# Patient Record
Sex: Female | Born: 1935 | Race: White | Hispanic: No | Smoking: Former smoker
Health system: Southern US, Community
[De-identification: ages and names within clinical notes are randomized; demographics above are authoritative.]

## PROBLEM LIST (undated history)

## (undated) DIAGNOSIS — N183 Chronic kidney disease, stage 3 unspecified: Secondary | ICD-10-CM

## (undated) DIAGNOSIS — K2270 Barrett's esophagus without dysplasia: Secondary | ICD-10-CM

## (undated) DIAGNOSIS — K589 Irritable bowel syndrome without diarrhea: Secondary | ICD-10-CM

## (undated) DIAGNOSIS — I4891 Unspecified atrial fibrillation: Secondary | ICD-10-CM

## (undated) DIAGNOSIS — F419 Anxiety disorder, unspecified: Secondary | ICD-10-CM

## (undated) DIAGNOSIS — F17201 Nicotine dependence, unspecified, in remission: Secondary | ICD-10-CM

## (undated) DIAGNOSIS — I10 Essential (primary) hypertension: Secondary | ICD-10-CM

## (undated) DIAGNOSIS — E119 Type 2 diabetes mellitus without complications: Secondary | ICD-10-CM

## (undated) DIAGNOSIS — E785 Hyperlipidemia, unspecified: Secondary | ICD-10-CM

## (undated) DIAGNOSIS — I2510 Atherosclerotic heart disease of native coronary artery without angina pectoris: Secondary | ICD-10-CM

## (undated) DIAGNOSIS — K5792 Diverticulitis of intestine, part unspecified, without perforation or abscess without bleeding: Secondary | ICD-10-CM

## (undated) DIAGNOSIS — N184 Chronic kidney disease, stage 4 (severe): Secondary | ICD-10-CM

## (undated) DIAGNOSIS — I679 Cerebrovascular disease, unspecified: Secondary | ICD-10-CM

## (undated) HISTORY — PX: BREAST EXCISIONAL BIOPSY: SUR124

## (undated) HISTORY — DX: Essential (primary) hypertension: I10

## (undated) HISTORY — PX: VENTRAL HERNIA REPAIR: SHX424

## (undated) HISTORY — DX: Atherosclerotic heart disease of native coronary artery without angina pectoris: I25.10

## (undated) HISTORY — DX: Hyperlipidemia, unspecified: E78.5

## (undated) HISTORY — PX: APPENDECTOMY: SHX54

## (undated) HISTORY — DX: Barrett's esophagus without dysplasia: K22.70

## (undated) HISTORY — DX: Cerebrovascular disease, unspecified: I67.9

## (undated) HISTORY — PX: THYROIDECTOMY, PARTIAL: SHX18

## (undated) HISTORY — PX: TONSILLECTOMY: SUR1361

## (undated) HISTORY — DX: Irritable bowel syndrome, unspecified: K58.9

## (undated) HISTORY — DX: Chronic kidney disease, stage 3 (moderate): N18.3

## (undated) HISTORY — DX: Anxiety disorder, unspecified: F41.9

## (undated) HISTORY — PX: KIDNEY SURGERY: SHX687

## (undated) HISTORY — DX: Diverticulitis of intestine, part unspecified, without perforation or abscess without bleeding: K57.92

## (undated) HISTORY — DX: Nicotine dependence, unspecified, in remission: F17.201

## (undated) HISTORY — DX: Chronic kidney disease, stage 3 unspecified: N18.30

---

## 1991-12-12 HISTORY — PX: COLOSTOMY: SHX63

## 1994-12-11 HISTORY — PX: CORONARY ARTERY BYPASS GRAFT: SHX141

## 2002-05-02 ENCOUNTER — Encounter: Payer: Self-pay | Admitting: General Surgery

## 2002-05-02 ENCOUNTER — Inpatient Hospital Stay (HOSPITAL_COMMUNITY): Admission: EM | Admit: 2002-05-02 | Discharge: 2002-05-10 | Payer: Self-pay | Admitting: Emergency Medicine

## 2002-05-02 ENCOUNTER — Encounter: Payer: Self-pay | Admitting: Emergency Medicine

## 2002-05-08 ENCOUNTER — Encounter: Payer: Self-pay | Admitting: Internal Medicine

## 2002-09-18 ENCOUNTER — Ambulatory Visit (HOSPITAL_COMMUNITY): Admission: RE | Admit: 2002-09-18 | Discharge: 2002-09-18 | Payer: Self-pay | Admitting: Internal Medicine

## 2002-09-18 ENCOUNTER — Encounter: Payer: Self-pay | Admitting: Internal Medicine

## 2002-12-07 ENCOUNTER — Inpatient Hospital Stay (HOSPITAL_COMMUNITY): Admission: EM | Admit: 2002-12-07 | Discharge: 2002-12-12 | Payer: Self-pay | Admitting: Emergency Medicine

## 2003-05-04 ENCOUNTER — Ambulatory Visit (HOSPITAL_COMMUNITY): Admission: RE | Admit: 2003-05-04 | Discharge: 2003-05-04 | Payer: Self-pay | Admitting: Internal Medicine

## 2003-07-24 ENCOUNTER — Ambulatory Visit (HOSPITAL_COMMUNITY): Admission: RE | Admit: 2003-07-24 | Discharge: 2003-07-24 | Payer: Self-pay | Admitting: Family Medicine

## 2003-07-24 ENCOUNTER — Encounter: Payer: Self-pay | Admitting: Family Medicine

## 2003-12-01 ENCOUNTER — Inpatient Hospital Stay (HOSPITAL_COMMUNITY): Admission: EM | Admit: 2003-12-01 | Discharge: 2003-12-05 | Payer: Self-pay | Admitting: Emergency Medicine

## 2003-12-12 DIAGNOSIS — K2270 Barrett's esophagus without dysplasia: Secondary | ICD-10-CM

## 2003-12-12 HISTORY — DX: Barrett's esophagus without dysplasia: K22.70

## 2003-12-12 HISTORY — PX: COLONOSCOPY: SHX174

## 2004-01-26 ENCOUNTER — Inpatient Hospital Stay (HOSPITAL_COMMUNITY): Admission: EM | Admit: 2004-01-26 | Discharge: 2004-01-27 | Payer: Self-pay | Admitting: Emergency Medicine

## 2004-04-11 ENCOUNTER — Encounter (INDEPENDENT_AMBULATORY_CARE_PROVIDER_SITE_OTHER): Payer: Self-pay | Admitting: Family Medicine

## 2004-04-11 ENCOUNTER — Ambulatory Visit (HOSPITAL_COMMUNITY): Admission: RE | Admit: 2004-04-11 | Discharge: 2004-04-11 | Payer: Self-pay | Admitting: Internal Medicine

## 2004-05-19 ENCOUNTER — Ambulatory Visit (HOSPITAL_COMMUNITY): Admission: RE | Admit: 2004-05-19 | Discharge: 2004-05-19 | Payer: Self-pay | Admitting: Family Medicine

## 2005-05-09 ENCOUNTER — Ambulatory Visit: Payer: Self-pay | Admitting: Internal Medicine

## 2005-07-19 ENCOUNTER — Ambulatory Visit (HOSPITAL_COMMUNITY): Admission: RE | Admit: 2005-07-19 | Discharge: 2005-07-19 | Payer: Self-pay | Admitting: Family Medicine

## 2005-08-02 ENCOUNTER — Ambulatory Visit: Payer: Self-pay | Admitting: Cardiology

## 2005-08-09 ENCOUNTER — Ambulatory Visit (HOSPITAL_COMMUNITY): Admission: RE | Admit: 2005-08-09 | Discharge: 2005-08-09 | Payer: Self-pay | Admitting: Family Medicine

## 2005-12-21 ENCOUNTER — Emergency Department (HOSPITAL_COMMUNITY): Admission: EM | Admit: 2005-12-21 | Discharge: 2005-12-21 | Payer: Self-pay | Admitting: Emergency Medicine

## 2006-05-02 ENCOUNTER — Ambulatory Visit (HOSPITAL_COMMUNITY): Admission: RE | Admit: 2006-05-02 | Discharge: 2006-05-02 | Payer: Self-pay | Admitting: Family Medicine

## 2006-05-07 ENCOUNTER — Ambulatory Visit: Payer: Self-pay | Admitting: Cardiology

## 2006-05-07 ENCOUNTER — Inpatient Hospital Stay (HOSPITAL_COMMUNITY): Admission: EM | Admit: 2006-05-07 | Discharge: 2006-05-10 | Payer: Self-pay | Admitting: Emergency Medicine

## 2006-07-20 ENCOUNTER — Ambulatory Visit: Payer: Self-pay | Admitting: Family Medicine

## 2006-08-17 ENCOUNTER — Ambulatory Visit: Payer: Self-pay | Admitting: Family Medicine

## 2006-09-05 ENCOUNTER — Ambulatory Visit: Payer: Self-pay | Admitting: Family Medicine

## 2006-09-06 ENCOUNTER — Ambulatory Visit (HOSPITAL_COMMUNITY): Admission: RE | Admit: 2006-09-06 | Discharge: 2006-09-06 | Payer: Self-pay | Admitting: Family Medicine

## 2006-10-03 ENCOUNTER — Ambulatory Visit: Payer: Self-pay | Admitting: Family Medicine

## 2006-10-31 ENCOUNTER — Encounter (INDEPENDENT_AMBULATORY_CARE_PROVIDER_SITE_OTHER): Payer: Self-pay | Admitting: Family Medicine

## 2006-11-07 ENCOUNTER — Ambulatory Visit: Payer: Self-pay | Admitting: Family Medicine

## 2006-11-09 ENCOUNTER — Encounter: Payer: Self-pay | Admitting: Family Medicine

## 2006-11-09 DIAGNOSIS — H269 Unspecified cataract: Secondary | ICD-10-CM

## 2006-11-09 DIAGNOSIS — F411 Generalized anxiety disorder: Secondary | ICD-10-CM | POA: Insufficient documentation

## 2006-11-09 DIAGNOSIS — D509 Iron deficiency anemia, unspecified: Secondary | ICD-10-CM

## 2006-11-09 DIAGNOSIS — E785 Hyperlipidemia, unspecified: Secondary | ICD-10-CM

## 2006-11-09 DIAGNOSIS — M810 Age-related osteoporosis without current pathological fracture: Secondary | ICD-10-CM | POA: Insufficient documentation

## 2006-11-09 DIAGNOSIS — E871 Hypo-osmolality and hyponatremia: Secondary | ICD-10-CM

## 2006-11-28 ENCOUNTER — Ambulatory Visit (HOSPITAL_COMMUNITY): Admission: RE | Admit: 2006-11-28 | Discharge: 2006-11-28 | Payer: Self-pay | Admitting: Family Medicine

## 2006-12-20 ENCOUNTER — Ambulatory Visit: Payer: Self-pay | Admitting: Family Medicine

## 2007-01-17 ENCOUNTER — Ambulatory Visit: Payer: Self-pay | Admitting: Family Medicine

## 2007-01-17 DIAGNOSIS — K219 Gastro-esophageal reflux disease without esophagitis: Secondary | ICD-10-CM

## 2007-01-17 DIAGNOSIS — I872 Venous insufficiency (chronic) (peripheral): Secondary | ICD-10-CM | POA: Insufficient documentation

## 2007-01-17 DIAGNOSIS — E119 Type 2 diabetes mellitus without complications: Secondary | ICD-10-CM | POA: Insufficient documentation

## 2007-01-17 LAB — CONVERTED CEMR LAB
Cholesterol, target level: 200 mg/dL
HDL goal, serum: 40 mg/dL
LDL Goal: 70 mg/dL

## 2007-01-28 ENCOUNTER — Encounter (INDEPENDENT_AMBULATORY_CARE_PROVIDER_SITE_OTHER): Payer: Self-pay | Admitting: Family Medicine

## 2007-02-18 ENCOUNTER — Telehealth (INDEPENDENT_AMBULATORY_CARE_PROVIDER_SITE_OTHER): Payer: Self-pay | Admitting: Family Medicine

## 2007-02-19 ENCOUNTER — Ambulatory Visit: Payer: Self-pay | Admitting: Family Medicine

## 2007-02-25 ENCOUNTER — Telehealth (INDEPENDENT_AMBULATORY_CARE_PROVIDER_SITE_OTHER): Payer: Self-pay | Admitting: Family Medicine

## 2007-03-15 ENCOUNTER — Encounter (INDEPENDENT_AMBULATORY_CARE_PROVIDER_SITE_OTHER): Payer: Self-pay | Admitting: Family Medicine

## 2007-03-19 ENCOUNTER — Encounter (INDEPENDENT_AMBULATORY_CARE_PROVIDER_SITE_OTHER): Payer: Self-pay | Admitting: Family Medicine

## 2007-04-08 ENCOUNTER — Encounter (INDEPENDENT_AMBULATORY_CARE_PROVIDER_SITE_OTHER): Payer: Self-pay | Admitting: Family Medicine

## 2007-04-16 ENCOUNTER — Encounter (INDEPENDENT_AMBULATORY_CARE_PROVIDER_SITE_OTHER): Payer: Self-pay | Admitting: Family Medicine

## 2007-04-17 ENCOUNTER — Ambulatory Visit: Payer: Self-pay | Admitting: Family Medicine

## 2007-04-17 DIAGNOSIS — K2270 Barrett's esophagus without dysplasia: Secondary | ICD-10-CM | POA: Insufficient documentation

## 2007-04-17 DIAGNOSIS — R1310 Dysphagia, unspecified: Secondary | ICD-10-CM | POA: Insufficient documentation

## 2007-04-17 DIAGNOSIS — J301 Allergic rhinitis due to pollen: Secondary | ICD-10-CM | POA: Insufficient documentation

## 2007-04-22 ENCOUNTER — Encounter (INDEPENDENT_AMBULATORY_CARE_PROVIDER_SITE_OTHER): Payer: Self-pay | Admitting: Family Medicine

## 2007-04-25 ENCOUNTER — Encounter (INDEPENDENT_AMBULATORY_CARE_PROVIDER_SITE_OTHER): Payer: Self-pay | Admitting: Family Medicine

## 2007-04-30 ENCOUNTER — Encounter (INDEPENDENT_AMBULATORY_CARE_PROVIDER_SITE_OTHER): Payer: Self-pay | Admitting: Family Medicine

## 2007-05-03 ENCOUNTER — Encounter (INDEPENDENT_AMBULATORY_CARE_PROVIDER_SITE_OTHER): Payer: Self-pay | Admitting: Family Medicine

## 2007-05-09 ENCOUNTER — Ambulatory Visit: Payer: Self-pay | Admitting: Internal Medicine

## 2007-05-13 ENCOUNTER — Encounter (INDEPENDENT_AMBULATORY_CARE_PROVIDER_SITE_OTHER): Payer: Self-pay | Admitting: Family Medicine

## 2007-05-13 ENCOUNTER — Ambulatory Visit (HOSPITAL_COMMUNITY): Admission: RE | Admit: 2007-05-13 | Discharge: 2007-05-13 | Payer: Self-pay | Admitting: Internal Medicine

## 2007-05-13 ENCOUNTER — Ambulatory Visit: Payer: Self-pay | Admitting: Internal Medicine

## 2007-05-13 ENCOUNTER — Encounter (INDEPENDENT_AMBULATORY_CARE_PROVIDER_SITE_OTHER): Payer: Self-pay | Admitting: Internal Medicine

## 2007-05-13 HISTORY — PX: ESOPHAGOGASTRODUODENOSCOPY: SHX1529

## 2007-05-14 ENCOUNTER — Encounter (INDEPENDENT_AMBULATORY_CARE_PROVIDER_SITE_OTHER): Payer: Self-pay | Admitting: Family Medicine

## 2007-05-16 ENCOUNTER — Encounter (INDEPENDENT_AMBULATORY_CARE_PROVIDER_SITE_OTHER): Payer: Self-pay | Admitting: Family Medicine

## 2007-05-18 ENCOUNTER — Encounter (INDEPENDENT_AMBULATORY_CARE_PROVIDER_SITE_OTHER): Payer: Self-pay | Admitting: Family Medicine

## 2007-05-20 LAB — CONVERTED CEMR LAB
AST: 17 units/L (ref 0–37)
Albumin: 4.6 g/dL (ref 3.5–5.2)
Alkaline Phosphatase: 50 units/L (ref 39–117)
BUN: 15 mg/dL (ref 6–23)
Basophils Relative: 1 % (ref 0–1)
Calcium: 9.1 mg/dL (ref 8.4–10.5)
Chloride: 97 meq/L (ref 96–112)
Eosinophils Absolute: 0.2 10*3/uL (ref 0.0–0.7)
HDL: 63 mg/dL (ref >39)
LDL Cholesterol: 83 mg/dL (ref 0–99)
Lymphs Abs: 1.2 10*3/uL (ref 0.7–3.3)
MCHC: 32.9 g/dL (ref 30.0–36.0)
Monocytes Relative: 9 % (ref 3–11)
Neutro Abs: 3.9 10*3/uL (ref 1.7–7.7)
Neutrophils Relative %: 67 % (ref 43–77)
Platelets: 285 10*3/uL (ref 150–400)
Potassium: 4.4 meq/L (ref 3.5–5.3)
RBC: 4.13 M/uL (ref 3.87–5.11)
Sodium: 136 meq/L (ref 135–145)
Total Protein: 7.8 g/dL (ref 6.0–8.3)
WBC: 5.8 10*3/uL (ref 4.0–10.5)

## 2007-05-24 ENCOUNTER — Encounter (INDEPENDENT_AMBULATORY_CARE_PROVIDER_SITE_OTHER): Payer: Self-pay | Admitting: Family Medicine

## 2007-06-17 ENCOUNTER — Emergency Department (HOSPITAL_COMMUNITY): Admission: EM | Admit: 2007-06-17 | Discharge: 2007-06-17 | Payer: Self-pay | Admitting: Emergency Medicine

## 2007-06-19 ENCOUNTER — Telehealth (INDEPENDENT_AMBULATORY_CARE_PROVIDER_SITE_OTHER): Payer: Self-pay | Admitting: *Deleted

## 2007-06-20 ENCOUNTER — Telehealth (INDEPENDENT_AMBULATORY_CARE_PROVIDER_SITE_OTHER): Payer: Self-pay | Admitting: *Deleted

## 2007-06-20 ENCOUNTER — Ambulatory Visit: Payer: Self-pay | Admitting: Family Medicine

## 2007-06-20 DIAGNOSIS — K589 Irritable bowel syndrome without diarrhea: Secondary | ICD-10-CM

## 2007-06-20 LAB — CONVERTED CEMR LAB
BUN: 12 mg/dL (ref 6–23)
Basophils Relative: 0 % (ref 0–1)
Bilirubin Urine: NEGATIVE
CO2: 24 meq/L (ref 19–32)
Chloride: 92 meq/L — ABNORMAL LOW (ref 96–112)
Glucose, Bld: 116 mg/dL
Glucose, Urine, Semiquant: NEGATIVE
Hemoglobin: 11.5 g/dL — ABNORMAL LOW (ref 12.0–15.0)
Ketones, urine, test strip: NEGATIVE
Lymphocytes Relative: 15 % (ref 12–46)
Lymphs Abs: 1.3 10*3/uL (ref 0.7–3.3)
MCHC: 33.7 g/dL (ref 30.0–36.0)
Monocytes Absolute: 0.6 10*3/uL (ref 0.2–0.7)
Monocytes Relative: 7 % (ref 3–11)
Neutro Abs: 6.2 10*3/uL (ref 1.7–7.7)
Neutrophils Relative %: 75 % (ref 43–77)
Potassium: 4.5 meq/L (ref 3.5–5.3)
RBC: 3.83 M/uL — ABNORMAL LOW (ref 3.87–5.11)
Specific Gravity, Urine: 1.015
WBC: 8.3 10*3/uL (ref 4.0–10.5)
pH: 7.5

## 2007-06-24 ENCOUNTER — Ambulatory Visit: Payer: Self-pay | Admitting: Family Medicine

## 2007-07-04 ENCOUNTER — Encounter (INDEPENDENT_AMBULATORY_CARE_PROVIDER_SITE_OTHER): Payer: Self-pay | Admitting: Family Medicine

## 2007-07-12 ENCOUNTER — Encounter (INDEPENDENT_AMBULATORY_CARE_PROVIDER_SITE_OTHER): Payer: Self-pay | Admitting: Family Medicine

## 2007-08-05 ENCOUNTER — Ambulatory Visit: Payer: Self-pay | Admitting: Family Medicine

## 2007-08-05 ENCOUNTER — Telehealth (INDEPENDENT_AMBULATORY_CARE_PROVIDER_SITE_OTHER): Payer: Self-pay | Admitting: *Deleted

## 2007-08-05 ENCOUNTER — Ambulatory Visit (HOSPITAL_COMMUNITY): Admission: RE | Admit: 2007-08-05 | Discharge: 2007-08-05 | Payer: Self-pay | Admitting: Family Medicine

## 2007-08-05 DIAGNOSIS — M25519 Pain in unspecified shoulder: Secondary | ICD-10-CM | POA: Insufficient documentation

## 2007-08-05 LAB — CONVERTED CEMR LAB: Hgb A1c MFr Bld: 6.1 %

## 2007-08-06 ENCOUNTER — Encounter (INDEPENDENT_AMBULATORY_CARE_PROVIDER_SITE_OTHER): Payer: Self-pay | Admitting: Family Medicine

## 2007-08-13 ENCOUNTER — Encounter (INDEPENDENT_AMBULATORY_CARE_PROVIDER_SITE_OTHER): Payer: Self-pay | Admitting: Family Medicine

## 2007-08-14 ENCOUNTER — Encounter (INDEPENDENT_AMBULATORY_CARE_PROVIDER_SITE_OTHER): Payer: Self-pay | Admitting: Family Medicine

## 2007-08-19 ENCOUNTER — Encounter (INDEPENDENT_AMBULATORY_CARE_PROVIDER_SITE_OTHER): Payer: Self-pay | Admitting: Family Medicine

## 2007-09-16 ENCOUNTER — Ambulatory Visit: Payer: Self-pay | Admitting: Family Medicine

## 2007-11-04 ENCOUNTER — Ambulatory Visit: Payer: Self-pay | Admitting: Family Medicine

## 2007-11-04 DIAGNOSIS — M1990 Unspecified osteoarthritis, unspecified site: Secondary | ICD-10-CM | POA: Insufficient documentation

## 2007-11-04 LAB — CONVERTED CEMR LAB

## 2007-11-13 ENCOUNTER — Encounter (INDEPENDENT_AMBULATORY_CARE_PROVIDER_SITE_OTHER): Payer: Self-pay | Admitting: Family Medicine

## 2007-11-14 ENCOUNTER — Encounter (INDEPENDENT_AMBULATORY_CARE_PROVIDER_SITE_OTHER): Payer: Self-pay | Admitting: Family Medicine

## 2007-12-13 ENCOUNTER — Encounter (INDEPENDENT_AMBULATORY_CARE_PROVIDER_SITE_OTHER): Payer: Self-pay | Admitting: Family Medicine

## 2008-01-14 ENCOUNTER — Encounter (INDEPENDENT_AMBULATORY_CARE_PROVIDER_SITE_OTHER): Payer: Self-pay | Admitting: Family Medicine

## 2008-02-05 ENCOUNTER — Ambulatory Visit: Payer: Self-pay | Admitting: Family Medicine

## 2008-02-05 LAB — CONVERTED CEMR LAB
Glucose, Bld: 111 mg/dL
Hgb A1c MFr Bld: 6.3 %

## 2008-02-11 ENCOUNTER — Encounter (INDEPENDENT_AMBULATORY_CARE_PROVIDER_SITE_OTHER): Payer: Self-pay | Admitting: Family Medicine

## 2008-02-20 ENCOUNTER — Encounter (INDEPENDENT_AMBULATORY_CARE_PROVIDER_SITE_OTHER): Payer: Self-pay | Admitting: Family Medicine

## 2008-02-21 ENCOUNTER — Telehealth (INDEPENDENT_AMBULATORY_CARE_PROVIDER_SITE_OTHER): Payer: Self-pay | Admitting: *Deleted

## 2008-02-21 LAB — CONVERTED CEMR LAB
ALT: 15 units/L (ref 0–35)
AST: 15 units/L (ref 0–37)
Alkaline Phosphatase: 41 units/L (ref 39–117)
Basophils Absolute: 0 10*3/uL (ref 0.0–0.1)
Basophils Relative: 0 % (ref 0–1)
Creatinine, Ser: 1.1 mg/dL (ref 0.40–1.20)
Creatinine, Urine: 69.5 mg/dL
Eosinophils Relative: 3 % (ref 0–5)
HCT: 34.9 % — ABNORMAL LOW (ref 36.0–46.0)
Hemoglobin: 11.3 g/dL — ABNORMAL LOW (ref 12.0–15.0)
LDL Cholesterol: 81 mg/dL (ref 0–99)
MCHC: 32.4 g/dL (ref 30.0–36.0)
Monocytes Absolute: 0.7 10*3/uL (ref 0.1–1.0)
Platelets: 246 10*3/uL (ref 150–400)
RDW: 13.5 % (ref 11.5–15.5)
Sodium: 137 meq/L (ref 135–145)
TSH: 1.418 microintl units/mL (ref 0.350–5.50)
Total Bilirubin: 0.4 mg/dL (ref 0.3–1.2)
Total CHOL/HDL Ratio: 2.8
VLDL: 34 mg/dL (ref 0–40)

## 2008-02-29 ENCOUNTER — Emergency Department (HOSPITAL_COMMUNITY): Admission: EM | Admit: 2008-02-29 | Discharge: 2008-02-29 | Payer: Self-pay | Admitting: Emergency Medicine

## 2008-03-06 ENCOUNTER — Encounter (INDEPENDENT_AMBULATORY_CARE_PROVIDER_SITE_OTHER): Payer: Self-pay | Admitting: Family Medicine

## 2008-03-11 ENCOUNTER — Ambulatory Visit: Payer: Self-pay | Admitting: Family Medicine

## 2008-03-11 DIAGNOSIS — N183 Chronic kidney disease, stage 3 (moderate): Secondary | ICD-10-CM

## 2008-03-12 ENCOUNTER — Encounter (INDEPENDENT_AMBULATORY_CARE_PROVIDER_SITE_OTHER): Payer: Self-pay | Admitting: Family Medicine

## 2008-03-18 ENCOUNTER — Encounter (INDEPENDENT_AMBULATORY_CARE_PROVIDER_SITE_OTHER): Payer: Self-pay | Admitting: Family Medicine

## 2008-04-09 ENCOUNTER — Encounter (INDEPENDENT_AMBULATORY_CARE_PROVIDER_SITE_OTHER): Payer: Self-pay | Admitting: Family Medicine

## 2008-05-07 ENCOUNTER — Ambulatory Visit (HOSPITAL_COMMUNITY): Admission: RE | Admit: 2008-05-07 | Discharge: 2008-05-07 | Payer: Self-pay | Admitting: Internal Medicine

## 2008-05-07 ENCOUNTER — Ambulatory Visit: Payer: Self-pay | Admitting: Internal Medicine

## 2008-05-07 LAB — CONVERTED CEMR LAB
ALT: 17 units/L (ref 0–35)
AST: 20 units/L (ref 0–37)
Basophils Absolute: 0 10*3/uL (ref 0.0–0.1)
Basophils Relative: 0 % (ref 0–1)
Blood in Urine, dipstick: NEGATIVE
Calcium: 8.7 mg/dL (ref 8.4–10.5)
Chloride: 97 meq/L (ref 96–112)
Creatinine, Ser: 1.25 mg/dL — ABNORMAL HIGH (ref 0.40–1.20)
Hemoglobin: 10.6 g/dL — ABNORMAL LOW (ref 12.0–15.0)
Inflenza A Ag: NEGATIVE
MCHC: 35.2 g/dL (ref 30.0–36.0)
Monocytes Absolute: 1 10*3/uL (ref 0.1–1.0)
Neutro Abs: 20.4 10*3/uL — ABNORMAL HIGH (ref 1.7–7.7)
Nitrite: NEGATIVE
RDW: 12.8 % (ref 11.5–15.5)
Specific Gravity, Urine: 1.01
Total Bilirubin: 0.5 mg/dL (ref 0.3–1.2)

## 2008-05-08 ENCOUNTER — Encounter (INDEPENDENT_AMBULATORY_CARE_PROVIDER_SITE_OTHER): Payer: Self-pay | Admitting: Internal Medicine

## 2008-05-11 ENCOUNTER — Ambulatory Visit (HOSPITAL_COMMUNITY): Admission: RE | Admit: 2008-05-11 | Discharge: 2008-05-11 | Payer: Self-pay | Admitting: Family Medicine

## 2008-05-11 ENCOUNTER — Ambulatory Visit: Payer: Self-pay | Admitting: Family Medicine

## 2008-05-13 LAB — CONVERTED CEMR LAB
Basophils Absolute: 0 10*3/uL (ref 0.0–0.1)
Eosinophils Relative: 3 % (ref 0–5)
Hemoglobin: 11.4 g/dL — ABNORMAL LOW (ref 12.0–15.0)
Lymphocytes Relative: 17 % (ref 12–46)
Lymphs Abs: 1.1 10*3/uL (ref 0.7–4.0)
Neutro Abs: 4.6 10*3/uL (ref 1.7–7.7)
Neutrophils Relative %: 72 % (ref 43–77)
Platelets: 243 10*3/uL (ref 150–400)
RBC: 3.67 M/uL — ABNORMAL LOW (ref 3.87–5.11)
RDW: 12.9 % (ref 11.5–15.5)

## 2008-05-14 ENCOUNTER — Telehealth (INDEPENDENT_AMBULATORY_CARE_PROVIDER_SITE_OTHER): Payer: Self-pay | Admitting: *Deleted

## 2008-05-18 ENCOUNTER — Ambulatory Visit: Payer: Self-pay | Admitting: Family Medicine

## 2008-05-19 ENCOUNTER — Telehealth (INDEPENDENT_AMBULATORY_CARE_PROVIDER_SITE_OTHER): Payer: Self-pay | Admitting: Family Medicine

## 2008-05-20 ENCOUNTER — Encounter (INDEPENDENT_AMBULATORY_CARE_PROVIDER_SITE_OTHER): Payer: Self-pay | Admitting: Family Medicine

## 2008-06-01 ENCOUNTER — Ambulatory Visit: Payer: Self-pay | Admitting: Family Medicine

## 2008-06-04 ENCOUNTER — Ambulatory Visit (HOSPITAL_COMMUNITY): Admission: RE | Admit: 2008-06-04 | Discharge: 2008-06-04 | Payer: Self-pay | Admitting: Family Medicine

## 2008-06-14 IMAGING — CT CT PELVIS W/O CM
2 of 4 series · 16 of 46 positions shown, 18 images · IV contrast (agent unspecified)
Comparison: 05/07/06.

CLINICAL DATA: Abdominal and pelvic pain.  Previous colon resection for perforation and appendectomy.  Allergic to IV contrast.  
 ABDOMEN CT WITHOUT CONTRAST:
TECHNIQUE: Multidetector CT imaging of the abdomen was performed following the standard protocol without IV contrast.  Oral contrast was administered.
TECHNIQUE: Multidetector CT imaging of the pelvis was performed following the standard protocol without IV contrast.

[Series 2: abd|pel w/o 5.0 b40f · axial · non-contrast · 0.66mm/px · z∈[-420,-35]mm · 13 of 85 slices shown, 15 images]
[im 4/85  soft-tissue]
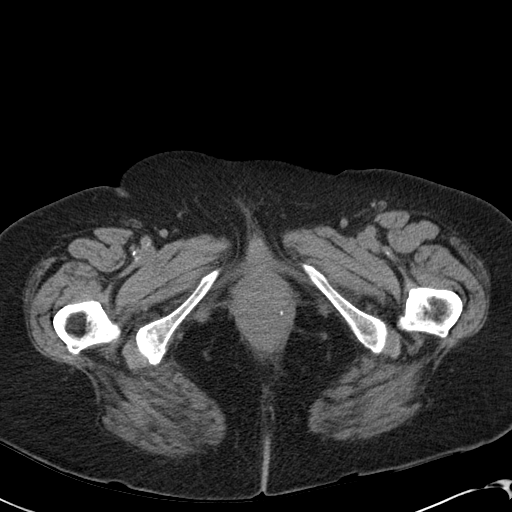
[im 4/85  bone]
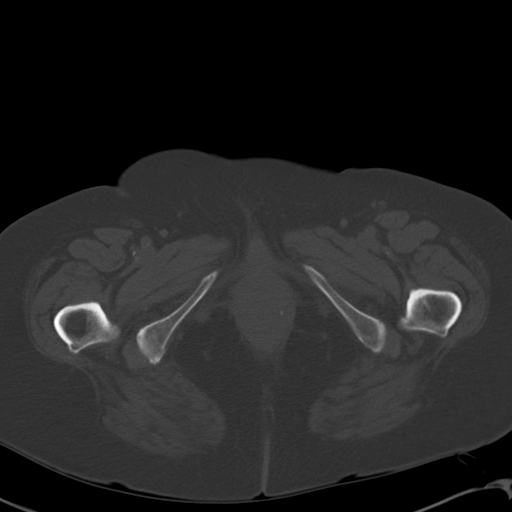
[im 11/85  soft-tissue]
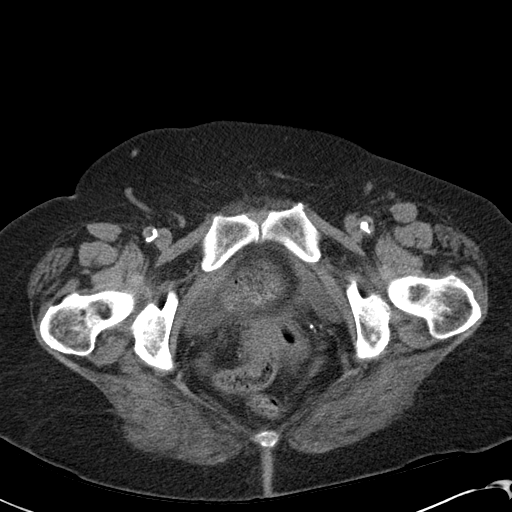
[im 17/85  soft-tissue]
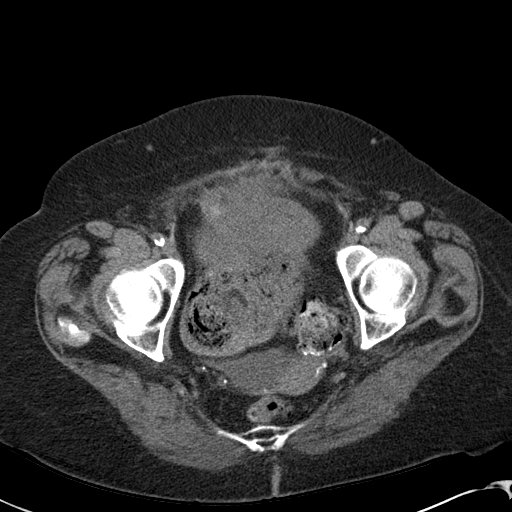
[im 24/85  soft-tissue]
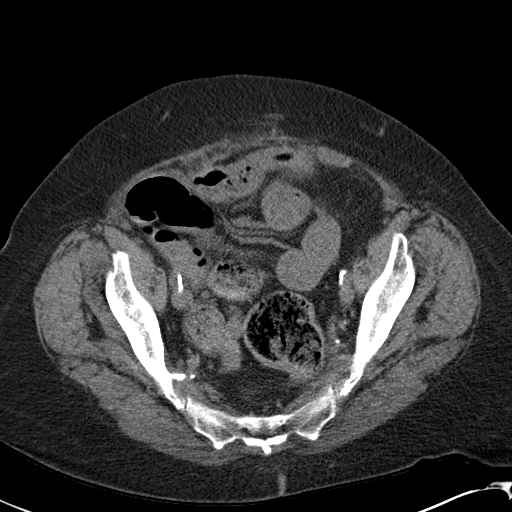
[im 31/85  soft-tissue]
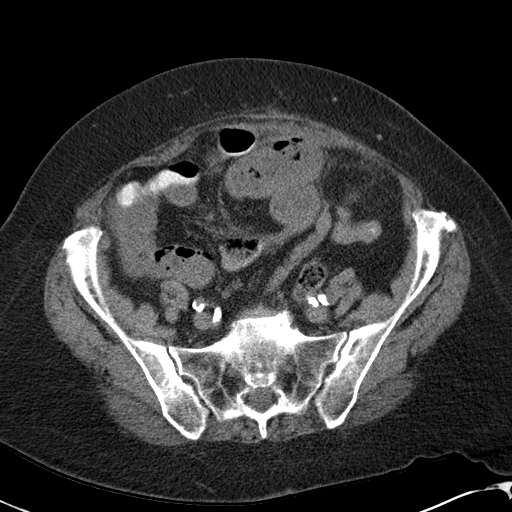
[im 37/85  soft-tissue]
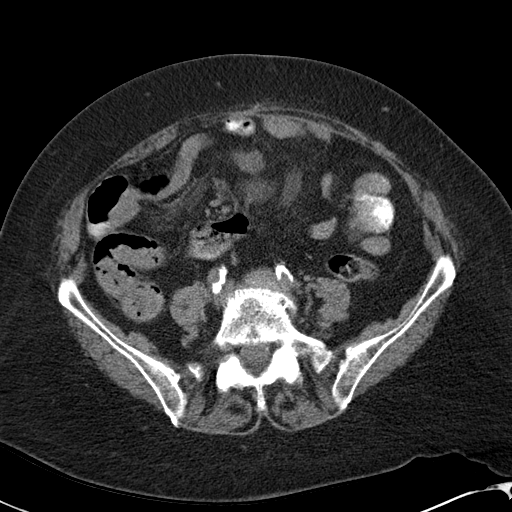
[im 44/85  soft-tissue]
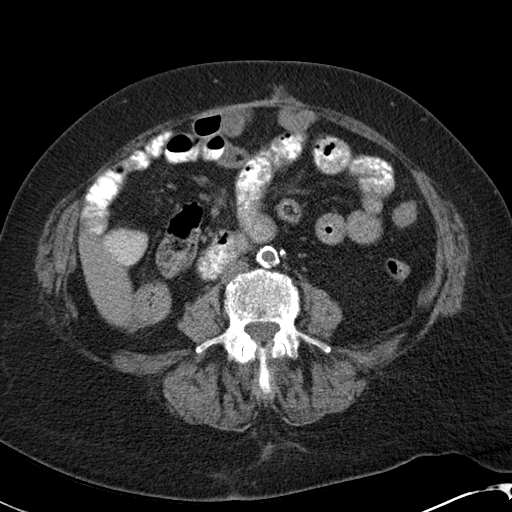
[im 48/85  soft-tissue]
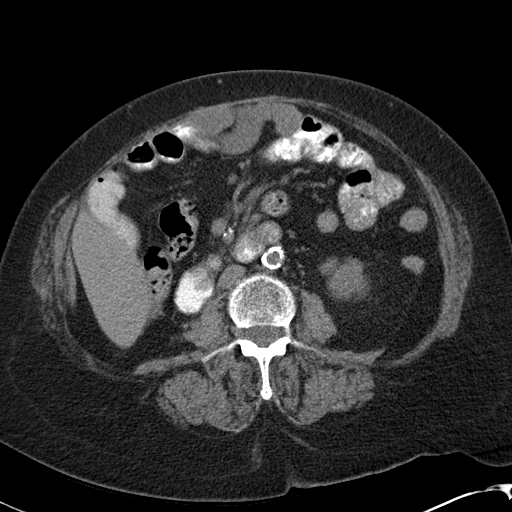
[im 54/85  soft-tissue]
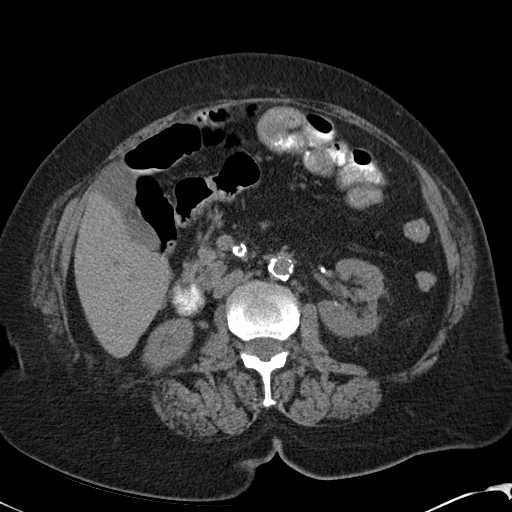
[im 54/85  bone]
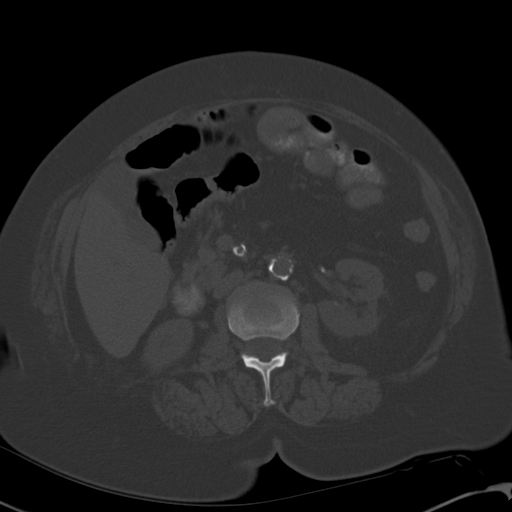
[im 61/85  soft-tissue]
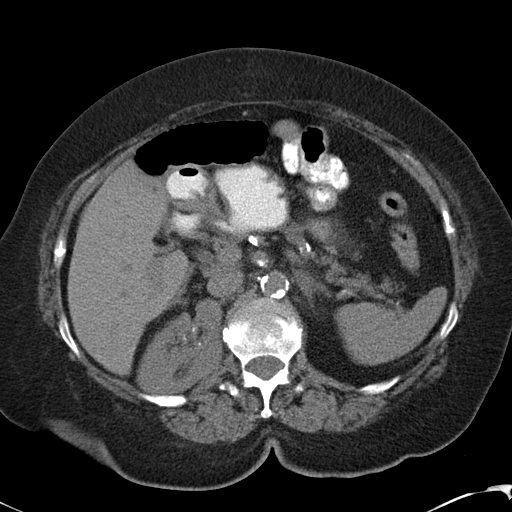
[im 68/85  soft-tissue]
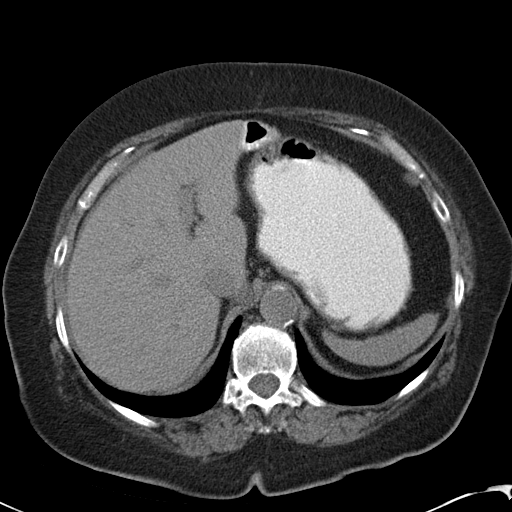
[im 74/85  soft-tissue]
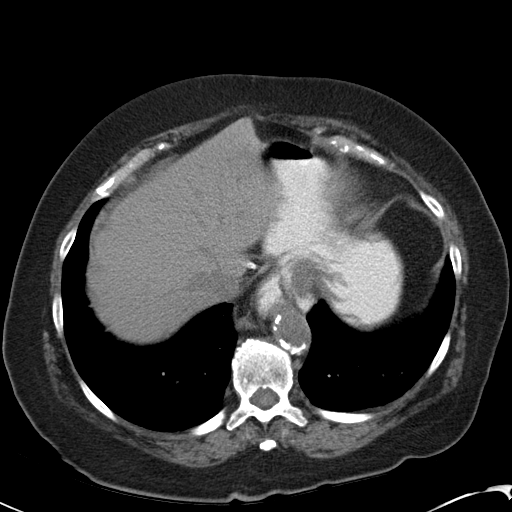
[im 81/85  soft-tissue]
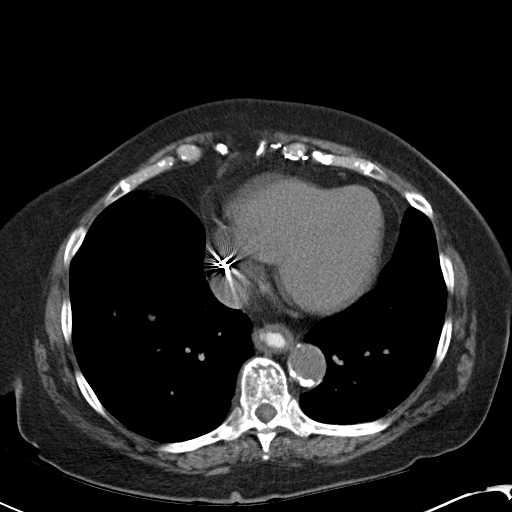

[Series 4: mpr coronal cor · coronal · 0.63mm/px · 3 of 90 slices shown]
[im 30/90  soft-tissue]
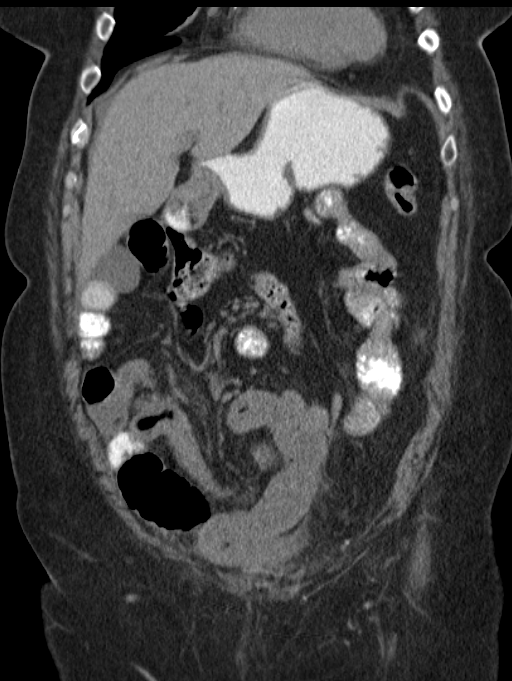
[im 40/90  soft-tissue]
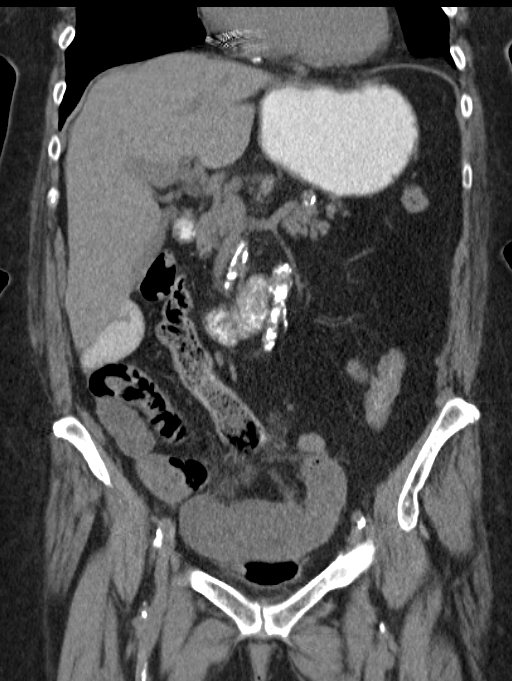
[im 50/90  soft-tissue]
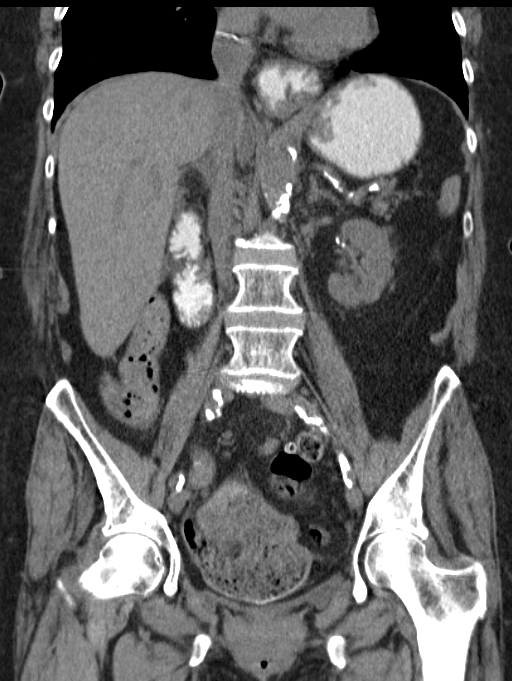

[16 of 46 positions shown; findings below may reference images not displayed]

FINDINGS: Bilateral renal scarring and vascular calcification remains stable.  There is no evidence of hydronephrosis.  Small fluid density cyst in the left hepatic lobe remains stable.  Small hiatal hernia is unchanged.  Gallbladder is unremarkable on appearance.  There is no evidence of mass or inflammatory process.  There is no evidence of dilated or thickened bowel loops.  No abnormal fluid collections are seen.
IMPRESSION: 1.  No acute findings.  
 2.  Stable bilateral renal scarring and vascular calcification.  
 3.  Small hiatal hernia.  
 PELVIS CT WITHOUT CONTRAST:
FINDINGS: There is no evidence of ureteral calculi or dilatation.  There is no evidence of pelvic mass.  There is no evidence of inflammatory process or abnormal fluid collections within the pelvis.  There is no evidence of dilated bowel loops.  Mild sigmoid diverticulosis is unchanged.  There is no evidence of diverticulitis.
IMPRESSION: 1.  No acute findings. 
 2.  Mild sigmoid diverticulosis again noted.

## 2008-07-10 ENCOUNTER — Encounter (INDEPENDENT_AMBULATORY_CARE_PROVIDER_SITE_OTHER): Payer: Self-pay | Admitting: Family Medicine

## 2008-07-31 ENCOUNTER — Emergency Department (HOSPITAL_COMMUNITY): Admission: EM | Admit: 2008-07-31 | Discharge: 2008-07-31 | Payer: Self-pay | Admitting: Emergency Medicine

## 2008-08-10 ENCOUNTER — Ambulatory Visit: Payer: Self-pay | Admitting: Family Medicine

## 2008-08-10 LAB — CONVERTED CEMR LAB: Glucose, Bld: 90 mg/dL

## 2008-08-12 ENCOUNTER — Encounter (INDEPENDENT_AMBULATORY_CARE_PROVIDER_SITE_OTHER): Payer: Self-pay | Admitting: Family Medicine

## 2008-08-21 ENCOUNTER — Ambulatory Visit: Payer: Self-pay | Admitting: Gastroenterology

## 2008-09-03 ENCOUNTER — Encounter (INDEPENDENT_AMBULATORY_CARE_PROVIDER_SITE_OTHER): Payer: Self-pay | Admitting: Family Medicine

## 2008-09-10 ENCOUNTER — Encounter (INDEPENDENT_AMBULATORY_CARE_PROVIDER_SITE_OTHER): Payer: Self-pay | Admitting: Family Medicine

## 2008-09-14 ENCOUNTER — Ambulatory Visit: Payer: Self-pay | Admitting: Family Medicine

## 2008-09-17 ENCOUNTER — Encounter (INDEPENDENT_AMBULATORY_CARE_PROVIDER_SITE_OTHER): Payer: Self-pay | Admitting: Family Medicine

## 2008-09-23 ENCOUNTER — Ambulatory Visit: Payer: Self-pay | Admitting: Family Medicine

## 2008-11-04 ENCOUNTER — Ambulatory Visit: Payer: Self-pay | Admitting: Family Medicine

## 2008-11-04 LAB — CONVERTED CEMR LAB
Glucose, Bld: 129 mg/dL
Hgb A1c MFr Bld: 6.2 %

## 2008-11-09 LAB — CONVERTED CEMR LAB
AST: 19 units/L (ref 0–37)
Albumin: 4.5 g/dL (ref 3.5–5.2)
Alkaline Phosphatase: 44 units/L (ref 39–117)
Calcium: 9.5 mg/dL (ref 8.4–10.5)
Chloride: 94 meq/L — ABNORMAL LOW (ref 96–112)
Eosinophils Absolute: 0.2 10*3/uL (ref 0.0–0.7)
Glucose, Bld: 117 mg/dL — ABNORMAL HIGH (ref 70–99)
Lymphs Abs: 1.1 10*3/uL (ref 0.7–4.0)
MCV: 89 fL (ref 78.0–100.0)
Neutro Abs: 4.4 10*3/uL (ref 1.7–7.7)
Neutrophils Relative %: 69 % (ref 43–77)
Platelets: 258 10*3/uL (ref 150–400)
Potassium: 4.3 meq/L (ref 3.5–5.3)
RBC: 4.09 M/uL (ref 3.87–5.11)
Sodium: 134 meq/L — ABNORMAL LOW (ref 135–145)
Total Protein: 7.6 g/dL (ref 6.0–8.3)
WBC: 6.4 10*3/uL (ref 4.0–10.5)

## 2008-11-13 ENCOUNTER — Encounter (INDEPENDENT_AMBULATORY_CARE_PROVIDER_SITE_OTHER): Payer: Self-pay | Admitting: Family Medicine

## 2008-12-17 ENCOUNTER — Encounter (INDEPENDENT_AMBULATORY_CARE_PROVIDER_SITE_OTHER): Payer: Self-pay | Admitting: Family Medicine

## 2009-02-03 ENCOUNTER — Ambulatory Visit (HOSPITAL_COMMUNITY): Admission: RE | Admit: 2009-02-03 | Discharge: 2009-02-03 | Payer: Self-pay | Admitting: Family Medicine

## 2009-02-03 ENCOUNTER — Ambulatory Visit: Payer: Self-pay | Admitting: Family Medicine

## 2009-02-03 LAB — CONVERTED CEMR LAB
Cholesterol: 202 mg/dL
LDL Cholesterol: 105 mg/dL
Triglycerides: 139 mg/dL

## 2009-02-10 ENCOUNTER — Encounter (INDEPENDENT_AMBULATORY_CARE_PROVIDER_SITE_OTHER): Payer: Self-pay | Admitting: Family Medicine

## 2009-02-11 ENCOUNTER — Encounter: Payer: Self-pay | Admitting: Gastroenterology

## 2009-02-11 ENCOUNTER — Ambulatory Visit: Payer: Self-pay | Admitting: Gastroenterology

## 2009-02-25 ENCOUNTER — Ambulatory Visit: Payer: Self-pay | Admitting: Cardiology

## 2009-03-02 ENCOUNTER — Ambulatory Visit: Payer: Self-pay | Admitting: Cardiology

## 2009-03-02 ENCOUNTER — Encounter (HOSPITAL_COMMUNITY): Admission: RE | Admit: 2009-03-02 | Discharge: 2009-04-01 | Payer: Self-pay | Admitting: Cardiology

## 2009-03-03 ENCOUNTER — Ambulatory Visit: Payer: Self-pay | Admitting: Family Medicine

## 2009-05-17 ENCOUNTER — Encounter: Payer: Self-pay | Admitting: Gastroenterology

## 2009-05-19 ENCOUNTER — Encounter (INDEPENDENT_AMBULATORY_CARE_PROVIDER_SITE_OTHER): Payer: Self-pay | Admitting: Family Medicine

## 2009-06-02 ENCOUNTER — Ambulatory Visit: Payer: Self-pay | Admitting: Family Medicine

## 2009-07-14 ENCOUNTER — Ambulatory Visit: Payer: Self-pay | Admitting: Family Medicine

## 2009-07-14 DIAGNOSIS — R1013 Epigastric pain: Secondary | ICD-10-CM

## 2009-08-25 ENCOUNTER — Ambulatory Visit: Payer: Self-pay | Admitting: Family Medicine

## 2009-08-25 LAB — CONVERTED CEMR LAB
Glucose, Bld: 119 mg/dL
Hgb A1c MFr Bld: 6.4 %

## 2009-09-07 ENCOUNTER — Encounter (INDEPENDENT_AMBULATORY_CARE_PROVIDER_SITE_OTHER): Payer: Self-pay | Admitting: Family Medicine

## 2009-12-12 ENCOUNTER — Emergency Department (HOSPITAL_COMMUNITY): Admission: EM | Admit: 2009-12-12 | Discharge: 2009-12-12 | Payer: Self-pay | Admitting: Emergency Medicine

## 2010-01-31 ENCOUNTER — Encounter (INDEPENDENT_AMBULATORY_CARE_PROVIDER_SITE_OTHER): Payer: Self-pay | Admitting: *Deleted

## 2010-02-02 ENCOUNTER — Encounter (INDEPENDENT_AMBULATORY_CARE_PROVIDER_SITE_OTHER): Payer: Self-pay | Admitting: *Deleted

## 2010-02-02 LAB — CONVERTED CEMR LAB
ALT: 14 units/L
Albumin: 4.3 g/dL
CO2: 28 meq/L
Calcium: 9.3 mg/dL
Glucose, Bld: 118 mg/dL
Potassium: 4.7 meq/L
Sodium: 137 meq/L
Total Protein: 7.2 g/dL
Triglycerides: 155 mg/dL

## 2010-03-10 ENCOUNTER — Encounter: Payer: Self-pay | Admitting: Gastroenterology

## 2010-03-11 ENCOUNTER — Ambulatory Visit: Payer: Self-pay | Admitting: Gastroenterology

## 2010-03-11 DIAGNOSIS — R197 Diarrhea, unspecified: Secondary | ICD-10-CM | POA: Insufficient documentation

## 2010-03-11 DIAGNOSIS — R112 Nausea with vomiting, unspecified: Secondary | ICD-10-CM | POA: Insufficient documentation

## 2010-05-16 ENCOUNTER — Encounter (INDEPENDENT_AMBULATORY_CARE_PROVIDER_SITE_OTHER): Payer: Self-pay | Admitting: *Deleted

## 2010-05-23 ENCOUNTER — Ambulatory Visit: Payer: Self-pay | Admitting: Cardiology

## 2010-05-31 ENCOUNTER — Encounter: Payer: Self-pay | Admitting: Cardiovascular Disease

## 2010-06-22 ENCOUNTER — Ambulatory Visit: Payer: Self-pay | Admitting: Cardiology

## 2010-06-22 ENCOUNTER — Ambulatory Visit (HOSPITAL_COMMUNITY)
Admission: RE | Admit: 2010-06-22 | Discharge: 2010-06-22 | Payer: Self-pay | Source: Home / Self Care | Admitting: Cardiology

## 2010-06-22 ENCOUNTER — Encounter: Payer: Self-pay | Admitting: Cardiology

## 2010-09-12 ENCOUNTER — Telehealth (INDEPENDENT_AMBULATORY_CARE_PROVIDER_SITE_OTHER): Payer: Self-pay | Admitting: *Deleted

## 2010-10-06 ENCOUNTER — Encounter (INDEPENDENT_AMBULATORY_CARE_PROVIDER_SITE_OTHER): Payer: Self-pay | Admitting: *Deleted

## 2010-10-06 LAB — CONVERTED CEMR LAB
BUN: 18 mg/dL
Basophils Relative: 1 %
Chloride: 98 meq/L
Creatinine, Ser: 1.19 mg/dL
Free T4: 1.12 ng/dL
HCT: 35.1 %
HDL: 72 mg/dL
Lymphocytes Relative: 15 %
Lymphs Abs: 1 10*3/uL
Monocytes Relative: 8 %
Platelets: 242 10*3/uL
RDW: 13.5 %
TSH: 1.869 microintl units/mL

## 2010-10-14 ENCOUNTER — Encounter (INDEPENDENT_AMBULATORY_CARE_PROVIDER_SITE_OTHER): Payer: Self-pay | Admitting: *Deleted

## 2011-01-01 ENCOUNTER — Encounter: Payer: Self-pay | Admitting: Emergency Medicine

## 2011-01-08 LAB — CONVERTED CEMR LAB: Hgb A1c MFr Bld: 6.2 %

## 2011-01-12 NOTE — Progress Notes (Signed)
  Phone Note From Pharmacy   Caller: [facility] Summary of Call: S: pt is on simvastatin 80mg  daily and amlodipine 5mg  daily B; last ov 05/23/10, last nurse visit 06/22/10 A:per protocol R: new statin Initial call taken by: Teressa Lower RN,  September 12, 2010 11:00 AM  Follow-up for Phone Call        Change simvastatin to atorvastatin 40 mg once daily FLP in 1 month If she cannot afford atorvastatin--> pravastatin 80mg  QD Follow-up by: Kathlen Brunswick, MD, [facility],  September 17, 2010 9:48 AM    New/Updated Medications: PRAVASTATIN SODIUM 80 MG TABS (PRAVASTATIN SODIUM) Take one tablet by mouth daily at bedtime Prescriptions: PRAVASTATIN SODIUM 80 MG TABS (PRAVASTATIN SODIUM) Take one tablet by mouth daily at bedtime  #30 x 3   Entered by:   Teressa Lower RN   Authorized by:   Kathlen Brunswick, MD, [facility]   Signed by:   Teressa Lower RN on 09/19/2010   Method used:   Electronically to        [facility] [address] (retail)       [address]       [place], [place]  [address]       Ph: [phone] or [phone]       Fax: [phone]   RxID:   [id number]

## 2011-01-12 NOTE — Assessment & Plan Note (Signed)
Summary: HIATAL HERNIA,DIARRHEA   Visit Type:  Follow-up Visit Primary Care Provider:  Dwana Silva  Chief Complaint:  F/U hiatal hernia/diarrhea.  History of Present Illness: Rare abd pain in LUQ but had shingles JAN 2011, diarrhea or constipation. No problems swallowing. Appetite is good. No problems eating. Last vomited and diarrhea ?2o to virus 1x since JAN 2011. Lasted 24 hours. Taking fiber two times a day. Rare dicylomine.  Current Medications (verified): 1)  Catapres 0.1 Mg Tabs (Clonidine Hcl) .... Every 7 Days 2)  Amlodipine Besylate 10 Mg  Tabs (Amlodipine Besylate) .... One Daily 3)  Simvastatin 80 Mg Tabs (Simvastatin) .... One Daily 4)  Klor-Con 20 Meq Pack (Potassium Chloride) .... One Daily As Needed With Lasix 5)  Rocaltrol 0.25 Mcg Caps (Calcitriol) .... Once Daily 6)  Xanax 0.25 Mg Tabs (Alprazolam) .... Once Daily 7)  Prevacid 30 Mg  Cpdr (Lansoprazole) .... One Daily 8)  Lasix 80 Mg Tabs (Furosemide) .... One Daily As Needed 9)  Aspir-Low 81 Mg Tbec (Aspirin) .... One By Mouth Daily. 10)  Clotrimazole-Betamethasone 1-0.05 %  Crea (Clotrimazole-Betamethasone) .... Apply To Affected Area Twice Daily For 3 Days 11)  Evista 60 Mg  Tabs (Raloxifene Hcl) .... One Daily 12)  Dicyclomine Hcl 10 Mg  Caps (Dicyclomine Hcl) .... Three Times A Day As Needed 13)  Benazepril Hcl 40 Mg  Tabs (Benazepril Hcl) .... One Daily 14)  Nystatin-Triamcinolone 100000-0.1 Unit/gm-%  Crea (Nystatin-Triamcinolone) .... Apply To Groin Areas Two Times A Day Until Skin Healed 15)  Diltiazem Hcl 120 Mg  Tabs (Diltiazem Hcl) .... Once Daily 16)  Tandem .... Take 1 Tablet By Mouth Once A Day 17)  Calcium 600 Mg .... Six Tablets Daily 18)  Tramadol Hcl 50 Mg Tabs (Tramadol Hcl) .... Take 1 Tablet By Mouth Three Times A Day As Needed 19)  Fiber Therapy .... Two Tablets Twice Daily  Allergies (verified): 1)  ! * Ivp Dye 2)  ! Doxycycline Hyclate (Doxycycline Hyclate) 3)  ! Adhesive Tape  Past  History:  Past Surgical History: Last updated: 06/02/2009 1. Appendectomy 2.Coronary artery bypass graft 5 vessel 3. Inguinal herniorrhaphy 4. Thyroidectomy for goiter 5. Tonsillectomy 6. Colostomy secondary to bowel perfortation in 1993  Past Medical History: Current Problems:  2008: EGD/NUR-EROSIVE ESOPHAGITIS 2005: FEDA-EGD/TCS-bARRETT'S ESOPHAGUS, HIATAL HERNIA, NORMAL COLONIC ANASTOMOSIS NAUSEA WITH VOMITING 2O TO HIATAL HERNIA WITH REFLUX (ICD-553.3) IRRITABLE BOWEL SYNDROME (ICD-564.1) DYSPHAGIA, UNSPECIFIED (ICD-787.20) DIABETES MELLITUS, TYPE II, CONTROLLED (ICD-250.00) DIVERTICULITIS, HX OF (ICD-V12.79) HYPERTENSION (ICD-401.9) CORONARY ARTERY DISEASE (ICD-414.00) ANXIETY (ICD-300.00)  CAROTID BRUIT, RIGHT (ICD-785.9) TINEA CRURIS (ICD-110.3) LEG PAIN, RIGHT (ICD-729.5) DEGENERATIVE JOINT DISEASE (ICD-715.90) PAIN IN JOINT, SHOULDER (ICD-719.41) ALLERGIC RHINITIS, SEASONAL (ICD-477.0) INSUFFICIENCY, VENOUS NOS (ICD-459.81) HYPOCALCEMIA (ICD-275.41) RENAL DISEASE, CHRONIC, STAGE III (ICD-585.3) HYPONATREMIA (ICD-276.1) BRONCHITIS (ICD-490) CATARACT NOS (ICD-366.9) OSTEOPOROSIS (ICD-733.00) HYPERLIPIDEMIA (ICD-272.4)  Review of Systems       weight 172 lbs 2009, 168 lbs 2008.   Vital Signs:  Patient profile:   77 year old female Height:      66 inches Weight:      160 pounds BMI:     25.92 Temp:     97.5 degrees F oral Pulse rate:   88 / minute BP sitting:   150 / 60  (left arm) Cuff size:   regular  Vitals Entered By: Cloria Spring LPN (March 11, 1609 9:33 AM)  Physical Exam  General:  Well developed, well nourished, no acute distress. Head:  Normocephalic and atraumatic. Lungs:  Clear  throughout to auscultation. Heart:  Regular rate and rhythm; no murmurs. Abdomen:  Soft, nontender and nondistended. Normal bowel sounds.  Impression & Recommendations:  Problem # 1:  NAUSEA WITH VOMITING (ICD-787.01) Assessment Improved  OPV IN 12 MOS.  Monitor for S/Sx of obtsruction or ischemia due to incarcerated hiatal hernia.  Orders: Est. Patient Level II ([address])  Problem # 2:  DIARRHEA (ICD-787.91) Assessment: Improved  BENTYL REFILLED. USE as needed.  CC: PCP  Orders: Est. Patient Level II ([address]) Prescriptions: DICYCLOMINE HCL 10 MG  CAPS (DICYCLOMINE HCL) three times a day as needed  #60 x 0   Entered and Authorized by:   West Bali MD   Signed by:   West Bali MD on 03/11/2010   Method used:   Electronically to        [facility] [address] (retail)       [address]       [place], [place]  [address]       Ph: [phone] or [phone]       Fax: [phone]   RxID:   [id number]

## 2011-01-12 NOTE — Assessment & Plan Note (Signed)
Summary: 1 YR F/U PER REMINDER LIST/TG   Visit Type:  Follow-up Referring Provider:  GI-Dr. Pecola Leisure Primary Provider:  Dwana Silva   History of Present Illness: [name] returns to the office for continued assessment and treatment of coronary artery disease and multiple cardiovascular risk factors.  Since she was last seen more than one year ago, she has done very well.  She has not required hospitalization or emergency medical care.  She did develop her third episode of herpes zoster for which treatment was received in the emergency department.  Symptoms of that illness have now resolved.  She experiences mild dyspnea on exertion, but no chest discomfort, orthopnea, PND nor pedal edema.  Laboratory studies obtained in February were excellent and included a perfectly normal chemistry profile except for a fasting glucose of 118.  Current Medications (verified): 1)  Catapres 0.1 Mg Tabs (Clonidine Hcl) .... Every 7 Days 2)  Amlodipine Besylate 5 Mg Tabs (Amlodipine Besylate) .... Take One Tablet By Mouth Daily 3)  Simvastatin 80 Mg Tabs (Simvastatin) .... One Daily 4)  Klor-Con 20 Meq Pack (Potassium Chloride) .... One Daily As Needed With Lasix 5)  Rocaltrol 0.25 Mcg Caps (Calcitriol) .... Once Daily 6)  Xanax 0.25 Mg Tabs (Alprazolam) .... Once Daily 7)  Prevacid 30 Mg  Cpdr (Lansoprazole) .... One Daily 8)  Lasix 80 Mg Tabs (Furosemide) .... One Daily As Needed 9)  Aspir-Low 81 Mg Tbec (Aspirin) .... One By Mouth Daily. 10)  Clotrimazole-Betamethasone 1-0.05 %  Crea (Clotrimazole-Betamethasone) .... Apply To Affected Area Twice Daily For 3 Days 11)  Evista 60 Mg  Tabs (Raloxifene Hcl) .... One Daily 12)  Dicyclomine Hcl 10 Mg  Caps (Dicyclomine Hcl) .... Three Times A Day As Needed 13)  Benazepril Hcl 40 Mg  Tabs (Benazepril Hcl) .... One Daily 14)  Nystatin-Triamcinolone 100000-0.1 Unit/gm-%  Crea (Nystatin-Triamcinolone) .... Apply To Groin Areas Two Times A Day Until Skin  Healed 15)  Diltiazem Hcl 120 Mg  Tabs (Diltiazem Hcl) .... Once Daily 16)  Tandem .... Take 1 Tablet By Mouth Once A Day 17)  Calcium 600 Mg .... Six Tablets Daily 18)  Tramadol Hcl 50 Mg Tabs (Tramadol Hcl) .... Take 1 Tablet By Mouth Three Times A Day As Needed 19)  Fiber Therapy .... Two Tablets Twice Daily 20)  Centrum Silver  Tabs (Multiple Vitamins-Minerals) .... Take 1 Tab Daily 21)  Promethazine Hcl 25 Mg Tabs (Promethazine Hcl) .... Take As Needed  Allergies (verified): 1)  ! * Ivp Dye 2)  ! Doxycycline Hyclate (Doxycycline Hyclate) 3)  ! Adhesive Tape  Past History:  PMH, FH, and Social History reviewed and updated.  Review of Systems       See history of present illness.  Vital Signs:  Patient profile:   77 year old female Weight:      163 pounds Pulse rate:   76 / minute BP sitting:   132 / 57  (right arm)  Vitals Entered By: Dreama Saa, CNA (May 23, 2010 2:00 PM)  Physical Exam  General:  Overweight; well developed; no acute distress:   Weight-163, 20 pounds less than in 02/2009 Neck-No JVD; bilateral carotid bruits: Lungs-No tachypnea, no rales; no rhonchi; no wheezes: Cardiovascular-normal PMI; normal S1 and S2; grade 1-2/6 systolic ejection murmur, best heard at the base.   Abdomen-BS normal; soft and non-tender without masses or organomegaly:  Musculoskeletal-No deformities, no cyanosis or clubbing: Neurologic-Normal cranial nerves; symmetric strength and tone:  Skin-Warm, no significant  lesions: Extremities-Distal pulses: 1-2+; no edema:     Impression & Recommendations:  Problem # 1:  ATHEROSCLEROTIC CARDIOVASCULAR DISEASE (ICD-429.2) Stress nuclear study was negative slightly more than one year ago except for an apparent breast attenuation artifact.  A small area of ischemia in the distribution of the left anterior descending artery cannot be unequivocally excluded.  Problem # 2:  CEREBROVASCULAR DISEASE (ICD-437.9) Cerebrovascular disease  is said to be moderate; however, no carotid ultrasound study is available in a chart.  We will perform a repeat study and see previous results as well as determine whether or not consultation with Dr. Madilyn Fireman was ever carried out.  Problem # 3:  HYPERLIPIDEMIA (ICD-272.4) Most recent lipid profile was performed in February at which time total cholesterol is 186, triglycerides 155, HDL 68 and LDL 87.  This represents adequate if not somewhat suboptimal control.  There is concern about continuing simvastatin 80 mg q.d., but she has been taking this medication for well more than one year without adverse effects.  No change in her medical regime will be recommended for the time being.  Other Orders: Carotid Duplex (Carotid Duplex) 2-D Echocardiogram (2D Echo)  Patient Instructions: 1)  Your physician recommends that you schedule a follow-up appointment in: 1 YEAR 2)  Your physician has recommended you make the following change in your medication: DECREASE AMLODIPINE 5 MG DAILY 3)  You have been referred to NURSE VISIT BP CHECK AND SWELLING CHECK 4)  Your physician has requested that you have a carotid duplex. This test is an ultrasound of the carotid arteries in your neck. It looks at blood flow through these arteries that supply the brain with blood. Allow one hour for this exam. There are no restrictions or special instructions. 5)  Your physician has requested that you have an echocardiogram.  Echocardiography is a painless test that uses sound waves to create images of your heart. It provides your doctor with information about the size and shape of your heart and how well your heart's chambers and valves are working.  This procedure takes approximately one hour. There are no restrictions for this procedure. 6)  Your physician has requested that you regularly monitor and record your blood pressure readings at home.  Please use the same machine at the same time of day to check your readings and record  them to bring to your follow-up visit. Prescriptions: AMLODIPINE BESYLATE 5 MG TABS (AMLODIPINE BESYLATE) Take one tablet by mouth daily  #30 x 3   Entered by:   Teressa Lower RN   Authorized by:   Kathlen Brunswick, MD, [facility]   Signed by:   Teressa Lower RN on 05/23/2010   Method used:   Electronically to        [facility] [address] (retail)       [address]       [place], [place]  [address]       Ph: [phone] or [phone]       Fax: [phone]   RxID:   [id number]

## 2011-01-12 NOTE — Letter (Signed)
Summary: PROGRESS NOTES 12-22-09 DR [facility]  PROGRESS NOTES 12-22-09 DR [facility]   Imported By: Faythe Ghee 05/31/2010 15:18:59  _____________________________________________________________________  External Attachment:    Type:   Image     Comment:   External Document

## 2011-01-12 NOTE — Letter (Signed)
Summary: LABS 12-21-09  LABS 12-21-09   Imported By: Faythe Ghee 05/31/2010 15:18:22  _____________________________________________________________________  External Attachment:    Type:   Image     Comment:   External Document

## 2011-01-12 NOTE — Miscellaneous (Signed)
Summary: LABS CMP,LIPIDS,A1C,02/02/2010  Clinical Lists Changes  Observations: Added new observation of CALCIUM: 9.3 mg/dL (91/47/8295 62:13) Added new observation of ALBUMIN: 4.3 g/dL (08/65/7846 96:29) Added new observation of PROTEIN, TOT: 7.2 g/dL (52/84/1324 40:10) Added new observation of SGPT (ALT): 14 units/L (02/02/2010 16:00) Added new observation of SGOT (AST): 17 units/L (02/02/2010 16:00) Added new observation of ALK PHOS: 44 units/L (02/02/2010 16:00) Added new observation of GFR AA: 58 mL/min/1.63m2 (02/02/2010 16:00) Added new observation of GFR: 48 mL/min (02/02/2010 16:00) Added new observation of CREATININE: 1.10 mg/dL (27/25/3664 40:34) Added new observation of BUN: 12 mg/dL (74/25/9563 87:56) Added new observation of BG RANDOM: 118 mg/dL (43/32/9518 84:16) Added new observation of CO2 PLSM/SER: 28 meq/L (02/02/2010 16:00) Added new observation of CL SERUM: 97 meq/L (02/02/2010 16:00) Added new observation of K SERUM: 4.7 meq/L (02/02/2010 16:00) Added new observation of NA: 137 meq/L (02/02/2010 16:00) Added new observation of LDL: 87 mg/dL (60/63/0160 10:93) Added new observation of HDL: 68 mg/dL (23/55/7322 02:54) Added new observation of TRIGLYC TOT: 155 mg/dL (27/05/2375 28:31) Added new observation of CHOLESTEROL: 186 mg/dL (51/76/1607 37:10) Added new observation of HGBA1C: 6.7 % (02/02/2010 16:00)

## 2011-01-12 NOTE — Letter (Signed)
Summary: Appointment Reminder  [facility]  [address]   [place], [place] [address]   Phone: [phone]  Fax: [phone]       January 31, 2010   [name] [address] [place], [place]  [address] [birth year]    Dear Ms. Trang,  We have been unable to reach you by phone to schedule a follow up   appointment that was recommended for you by Dr. Darrick Penna. It is very   important that we reach you to schedule an appointment. We hope that you  allow [place] to participate in your health care needs. Please contact us at  [phone] at your earliest convenience to schedule your appointment.  Sincerely,    [facility] R. Roetta Sessions, M.D.    Kassie Mends, M.D. Lorenza Burton, FNP-BC    Tana Coast, PA-C Phone: [phone]    Fax: [phone]

## 2011-01-12 NOTE — Assessment & Plan Note (Signed)
Summary: 1 mth nurse visit per checkout on 05/23/10/tg  Nurse Visit   Vital Signs:  Patient profile:   77 year old female Height:      66 inches Weight:      161 pounds O2 Sat:      96 % on Room air Temp:     96.7 degrees F oral Pulse rate:   83 / minute BP sitting:   146 / 68  (left arm)  Vitals Entered By: Teressa Lower RN (June 22, 2010 11:25 AM)  O2 Flow:  Room air  Preventive Screening-Counseling & Management  Alcohol-Tobacco     Smoking Status: quit > 6 months  Visit Type:  1 month nurse visit Referring Provider:  GI-Dr. Pecola Leisure Primary Provider:  [name]   History of Present Illness: S: 1 month nurse visit B: last ov 05/23/10, decrease amlodipine to 5mg  daily, recheck bp and edema A: no c/o, feet are still puffy, but no longer pitting edema, pt states she feels much better bp diary returned 30 readings: average hr   71 average sbp     112  average dbp    59 R:  06/22/10  Continue current Rx.  Schofield Bing, M.D.    Allergies: 1)  ! * Ivp Dye 2)  ! Doxycycline Hyclate (Doxycycline Hyclate) 3)  ! Adhesive Tape  I spoke with pt, no new orders, verbalized understanding   Teressa Lower RN  June 23, 2010 10:54 AM

## 2011-01-12 NOTE — Letter (Signed)
Summary: LABS 05-18-10 DR [facility]  LABS 05-18-10 DR [id number]   Imported By: Faythe Ghee 05/31/2010 15:19:27  _____________________________________________________________________  External Attachment:    Type:   Image     Comment:   External Document

## 2011-01-12 NOTE — Letter (Signed)
Summary: BP READINGS  BP READINGS   Imported By: Faythe Ghee 06/22/2010 12:22:11  _____________________________________________________________________  External Attachment:    Type:   Image     Comment:   External Document

## 2011-01-12 NOTE — Letter (Signed)
Summary: EGD/PATH/NUR 2008  EGD/PATH/NUR 2008   Imported By: Diana Eves 03/10/2010 10:41:43  _____________________________________________________________________  External Attachment:    Type:   Image     Comment:   External Document

## 2011-01-12 NOTE — Letter (Signed)
Summary: EGD/TCS/PATH/NUR 2005  EGD/TCS/PATH/NUR 2005   Imported By: Diana Eves 03/10/2010 10:43:57  _____________________________________________________________________  External Attachment:    Type:   Image     Comment:   External Document

## 2011-01-12 NOTE — Miscellaneous (Signed)
Summary: [facility] labs cmp,cbcd,lipids,a1c,10/06/2010  Clinical Lists Changes  Observations: Added new observation of GFR AA: 53 mL/min/1.33m2 (10/06/2010 10:57) Added new observation of GFR: 44 mL/min (10/06/2010 10:57) Added new observation of CREATININE: 1.19 mg/dL (16/09/9603 54:09) Added new observation of BUN: 18 mg/dL (81/19/1478 29:56) Added new observation of BG RANDOM: 118 mg/dL (21/30/8657 84:69) Added new observation of CO2 PLSM/SER: 24 meq/L (10/06/2010 10:57) Added new observation of CL SERUM: 98 meq/L (10/06/2010 10:57) Added new observation of K SERUM: 4.3 meq/L (10/06/2010 10:57) Added new observation of NA: 136 meq/L (10/06/2010 10:57) Added new observation of LDL: 112 mg/dL (62/95/2841 32:44) Added new observation of HDL: 72 mg/dL (12/13/7251 66:44) Added new observation of TRIGLYC TOT: 150 mg/dL (03/47/4259 56:38) Added new observation of CHOLESTEROL: 214 mg/dL (75/64/3329 51:88) Added new observation of TSH: 1.869 microintl units/mL (10/06/2010 10:57) Added new observation of T4, FREE: 1.12 ng/dL (41/66/0630 16:01) Added new observation of HGBA1C: 6.5 % (10/06/2010 10:57) Added new observation of ABSOLUTE BAS: 0.1 K/uL (10/06/2010 10:57) Added new observation of BASOPHIL %: 1 % (10/06/2010 10:57) Added new observation of EOS ABSLT: 0.1 K/uL (10/06/2010 10:57) Added new observation of % EOS AUTO: 1 % (10/06/2010 10:57) Added new observation of ABSOLUTE MON: 0.5 K/uL (10/06/2010 10:57) Added new observation of MONOCYTE %: 8 % (10/06/2010 10:57) Added new observation of ABS LYMPHOCY: 1.0 K/uL (10/06/2010 10:57) Added new observation of LYMPHS %: 15 % (10/06/2010 10:57) Added new observation of PLATELETK/UL: 242 K/uL (10/06/2010 10:57) Added new observation of RDW: 13.5 % (10/06/2010 10:57) Added new observation of MCHC RBC: 33.0 g/dL (09/32/3557 32:20) Added new observation of MCV: 89.1 fL (10/06/2010 10:57) Added new observation of HCT: 35.1 % (10/06/2010  10:57) Added new observation of HGB: 11.6 g/dL (25/42/7062 37:62) Added new observation of RBC M/UL: 3.94 M/uL (10/06/2010 10:57) Added new observation of WBC COUNT: 6.5 10*3/microliter (10/06/2010 10:57)

## 2011-03-23 ENCOUNTER — Ambulatory Visit (INDEPENDENT_AMBULATORY_CARE_PROVIDER_SITE_OTHER): Payer: Medicare Other | Admitting: Gastroenterology

## 2011-03-23 ENCOUNTER — Encounter: Payer: Self-pay | Admitting: Gastroenterology

## 2011-03-23 DIAGNOSIS — K449 Diaphragmatic hernia without obstruction or gangrene: Secondary | ICD-10-CM

## 2011-03-23 DIAGNOSIS — K589 Irritable bowel syndrome without diarrhea: Secondary | ICD-10-CM

## 2011-03-23 NOTE — Assessment & Plan Note (Signed)
No evidence of obstruction. Continue Prevacid. OPV in 12 mos.

## 2011-03-23 NOTE — Progress Notes (Signed)
Reminder in epic for pt to follow up in one year

## 2011-03-23 NOTE — Progress Notes (Signed)
  Subjective:    Patient ID: [name], female    DOB: Apr 17, 1928, 77 y.o.   MRN: [id number]  HPI Since APR 2011 only one episode of vomiting, abd pain (epigastric), and diarrhea. Sx lasted: overall 12 hours.  Preceded by feeling like she's swelling and it took her breath away. Took dicyclomine and another pill and then relaxed and got some sleep. Feels okay today. Pt only used Dicyclomine once in past year.   Past Medical History  Diagnosis Date  . Sliding hiatal hernia     causes periodic NV, SURGEY RISKS > BENEFITS  . Esophagitis, erosive 2008 NUR EGD  . Barrett's esophagus 2005 NUR EGD  . GERD (gastroesophageal reflux disease)   . Irritable bowel syndrome   . Dysphagia   . Diverticulitis hX OF  . Hypertension   . CAD (coronary artery disease)   . Anxiety   . Hyperlipidemia   . Osteoporosis    Past Surgical History  Procedure Date  . Colon surgery   . Colonoscopy 2005 NUR     Review of Systems 2008 168 LBS 2009 172 LBS 2010 168 lbs    Objective:   Physical Exam  Constitutional: She is oriented to person, place, and time. She appears well-developed and well-nourished. No distress.  HENT:  Head: Normocephalic and atraumatic.  Mouth/Throat: Oropharynx is clear and moist.  Eyes: Pupils are equal, round, and reactive to light.  Cardiovascular: Normal rate and regular rhythm.   Pulmonary/Chest: Effort normal and breath sounds normal.  Abdominal: Soft. Bowel sounds are normal.  Musculoskeletal: She exhibits no edema.  Neurological: She is alert and oriented to person, place, and time.  Psychiatric: She has a normal mood and affect.          Assessment & Plan:

## 2011-03-23 NOTE — Assessment & Plan Note (Signed)
1 EPISODE SINCE 2011. Use bentyl prn. OPV in 12 mos.

## 2011-04-25 NOTE — Op Note (Signed)
NAMEISRAEL, WUNDER               ACCOUNT NO.:  [id number]   MEDICAL RECORD NO.:  [id number]          PATIENT TYPE:  AMB   LOCATION:  DAY                           FACILITY:  [facility]   PHYSICIAN:  Lionel December, M.D.    DATE OF BIRTH:  1928/08/04   DATE OF PROCEDURE:  05/13/2007  DATE OF DISCHARGE:                               OPERATIVE REPORT   PROCEDURE:  Esophagogastroduodenoscopy with balloon dilation of  esophageal stricture.   INDICATION:  The patient is 77 year old Caucasian female with chronic  GERD maintained on PPI who is been experiencing this dysphagia to  solids, pills as well as liquids.  She has been on Fosamax for about 3  months.  She is not sure if her dysphagia started after she had been on  this medicine.  Procedure risks were reviewed the patient, informed  consent was obtained.   MEDS FOR CONSCIOUS SEDATION:  Benzocaine spray for pharyngeal topical  anesthesia, Demerol 50 mg IV, Versed 5 mg IV in divided dose.   FINDINGS:  Procedure performed in endoscopy suite.  The patient's vital  signs and O2 saturations were monitored during procedure and remained  stable.  The patient was placed left lateral position and Pentax  videoscope was passed via oropharynx without any difficulty into  esophagus.   Esophagus. Mucosa of the proximal and middle third was normal.  The  distal mucosa revealed some linear scarring and pseudodiverticular  formation (two).  Distal esophagus was somewhat tortuous as she had  hiatal hernia.  The GE junction was estimated to be at 35 cm.  She had a  rim of abnormal looking mucosa proximal to it which was biopsied post  dilation to rule out Barrett's.  A soft stricture was noted at the GE  junction.  Hiatus was wide open at 40 cm.  There was dependent part of  the hernia on the left side.   Stomach.  It was empty and distended very well with insufflation.  Folds  proximal stomach were normal. Examination mucosa at body, antrum,  pyloric channel as well as angularis, fundus and cardia was normal.   Duodenum. Bulbar mucosa was normal.  Scope was passed second part of  duodenum where mucosa and folds were normal.   Endoscope was withdrawn into the body of the stomach.  Balloon dilator  was passed through the scope.  Guidewire was pushed into gastric lumen.  Balloon dilator was positioned across distal esophagus by withdrawing  the scope into the body of the esophagus.  Balloon was initially  insufflated to a diameter of 18 mm but no mucosal disruption noted.  Subsequently diameter was increased to 19 and 20, resulting in a small  mucosal disruption which was fairly localized at GE junction.  Balloon  was deflated withdrawn.  Biopsy was taken from this patch of salmon-  colored esophageal mucosa.  Endoscope was withdrawn.  The patient  tolerated the procedure well.   FINAL DIAGNOSES:  1. Soft stricture at the GE junction with evidence of esophageal      scarring and pseudodiverticular formation.  2. Soft stricture at  GE junction which was dilated with a balloon up      to 20 mm.  3. Short segment of abnormal mucosa which was biopsied to rule out      Barrett's.  4. Moderate-sized sliding hiatal hernia.   RECOMMENDATIONS:  She will continue antireflux measures and PPI as  before.  The patient advised to discontinue Fosamax.  She will follow  with Dr. Erby Pian to consider alternatives.  I will be contacting  patient results of biopsy.  If she remains with dysphagia she will need  further evaluation.      Lionel December, M.D.  Electronically Signed     NR/MEDQ  D:  05/13/2007  T:  05/13/2007  Job:  [id number]   cc:   Franchot Heidelberg, M.D.

## 2011-04-25 NOTE — H&P (Signed)
[name], [name]               ACCOUNT NO.:  [id number]   MEDICAL RECORD NO.:  [id number]          PATIENT TYPE:  AMB   LOCATION:  DAY                           FACILITY:  [facility]   PHYSICIAN:  Lionel December, M.D.    DATE OF BIRTH:  1928-07-15   DATE OF ADMISSION:  05/09/2007  DATE OF DISCHARGE:  LH                              HISTORY & PHYSICAL   REFERRING PHYSICIAN:  Franchot Heidelberg, M.D.   CHIEF COMPLAINT:  Intermittent dysphagia.   HISTORY OF PRESENT ILLNESS:  [name] is a 77 year old Caucasian female  patient who has been seen by Dr. Karilyn Cota previously for iron-deficiency  anemia, GERD, and diarrhea.  She tells me over approximately the last  three months, she has had intermittent dysphagia a couple of times a  week with regurgitation.  She feels as though food gets stuck in her mid  esophagus.  It becomes lodged, and she is unable to make it move.  She  has problems with pills, solids, and liquids.  She can occasionally  flush it with liquids, but usually she will just regurgitation whatever  she tries to drink in order to flush my food down.  She denies any  nausea or vomiting.  Denies any anorexia or early satiety.  She has had  a history of low abdominal cramping chronically along with urgency.  She  is on Levbid for this, which seems to be working well.  She complains of  occasional heartburn.  Denies any problems with indigestion.  She is on  AcipHex 20 mg daily.   PAST MEDICAL HISTORY:  Hypertension, coronary artery disease, COPD.  She  had a CABG in 1996.  She had a sigmoid colon in 1993 for a perforated  diverticulitis.  She had a colostomy with take-down.  She had ischemic  colitis in March, 2000.  She has a history of chronic diarrhea (IBS).  She has osteoarthritis.  She is status post appendectomy, subtotal  thyroidectomy for goiter.  She had surgery on one of her kidneys for  ptosis.  She had iron-deficiency anemia in December, 2004, which  required 2  units of red blood cells.  She had an EGD and colonoscopy in  May, 2005.  She had pseudodiverticulum at the distal esophagus and a  moderate sized hiatal hernia.  It was felt that she may have had  Barrett's; however, this is ruled out by biopsies.  She had a  nonspecific mild sigmoid colitis.  Colorectal anastomosis was wide open.  She did have external hemorrhoids.  She has a history of cataracts,  diabetes mellitus, carotid bruit, hypocalcemia, chronic renal  insufficiency, allergic rhinitis, venous insufficiency.   She is allergic to X-RAY DYE and ADHESIVE TAPE.   CURRENT MEDICATIONS:  1. Fosamax 70 mg weekly.  2. Tandem once daily for three weeks and off for seven days, or      Repliva.  3. Catapres patch every 7 days.  4. Lotrel 10/40 mg daily.  5. Vytorin 10/40 mg daily.  6. Cardizem 120 mg daily.  7. Klor-Con 20 mEq p.r.n.  8. Lasix 80  mg p.r.n.  9. Xanax 0.25 mg daily.  10.AcipHex 20 mg daily.  11.Levbid 0.375 mg daily.  12.Calcium 600 mg 6 daily.  13.Aspirin 81 mg daily.  14.Centrum Silver once daily.  15.Fiber supplement 2 p.o. b.i.d.  16.Rocaltrol 25 mg daily.   ALLERGIES:  IV DYE, ADHESIVE TAPE.   FAMILY HISTORY:  There is no known family history of carcinoma of liver,  or chronic GI problems.   SOCIAL HISTORY:  [name] is widowed.  Her son lives with her.  She is  retired.  She has a remote history of tobacco use.  Denies alcohol or  drug use.   REVIEW OF SYSTEMS:  CONSTITUTIONAL:  Weight has remained stable.  Denies  any fevers or chills.  CARDIOVASCULAR:  Denies any chest pain or  palpitations.  PULMONARY:  No shortness of breath, dyspnea, cough,  hemoptysis.  GI:  See HPI.   PHYSICAL EXAMINATION:  VITAL SIGNS:  Weight 172 pounds.  Height 66  inches.  Temp 97.8, blood pressure 120/70, pulse 88.  GENERAL:  [name] is a 77 year old Caucasian female who is alert,  oriented, pleasant, and cooperative in no acute distress.  HEENT:  Sclerae are clear.   Nonicteric.  Conjunctivae are pale.  Oropharynx pink and moist.  She has upper dentures intact.  NECK:  Supple without mass or thyromegaly.  CHEST:  Regular rate and rhythm with a 2/6 murmur noted.  LUNGS:  Clear to auscultation bilaterally.  ABDOMEN:  Positive bowel sounds x4.  __________ .  Nontender,  nondistended without palpable mass or splenomegaly.  No rebound  tenderness or guarding.  EXTREMITIES:  Without clubbing.  She does have 2+ lower extremity edema  bilaterally.   IMPRESSION:  [name] is a 77 year old female with worsening  intermittent dysphagia over the last three months.  Symptoms with pills,  solids, and liquids, which is concerning, and she is going to need  further evaluation to rule out high-grade stricture, Weber ring, or  occult malignancy, which is less likely.   PLAN:  1. Continue AcipHex 20 mg daily.  2. She is scheduled for an EGD with esophageal dilatation with Dr.      Karilyn Cota in the near future.   I have discussed the procedure, including risks and benefits, including  but are not limited to bleeding, infection, perforation, or drug  reaction.  She agrees, and informed consent will be obtained.   We would like to thank Dr. Erby Pian for allowing [place] to participate in  the care of [name].      Nicholas Lose, N.P.      Lionel December, M.D.  Electronically Signed    KC/MEDQ  D:  05/10/2007  T:  05/10/2007  Job:  [id number]   cc:   Franchot Heidelberg, M.D.

## 2011-04-25 NOTE — Letter (Signed)
February 25, 2009    Franchot Heidelberg, MD  [address]  [place], [place] [address]   RE:  [name], [name]  MRN:  [id number]  /  DOB:  1927/12/14   Dear Remi Haggard:   It was my pleasure reevaluating [name] in the office today in  consultation at your request.  This nice woman was scheduled to return  to see me in 2007, but has subsequently been lost to follow up.  As you  know, she has coronary artery disease and has done extremely well since  CABG surgery in 1996.  She had normal left ventricular systolic function  when I last assessed a few years ago.  She has hypertension and  hyperlipidemia that have been well controlled.  She does not use tobacco  products for many years.  She has had stable cerebrovascular disease  without significant obstructive disease that was somewhat progressive  when last assessed in May 2009.   Current medications are as listed in your recent office note, which was  obtained and reviewed.  She apparently has renal disease and is followed  by Dr. Kristian Covey.  The last creatinine value I have for her is from some  years ago and was normal.   She has intermittently been troubled by abdominal discomfort and is  currently being evaluated by Dr. Cira Servant.  She has a history of  diverticular disease and GERD.  She has had episodes of epigastric pain  that last up to a few hours.  She experiences some chest heaviness with  this, but no dyspnea, diaphoresis, nor nausea.  Episodes occur a few  times per month.  She has not found anything that effectively relieves  the discomfort other than rest in local pressure.  There is no  radiation.   Past medical, social, family history, and review of systems were  updated.  She has been seen in the emergency department on least one of  occasion for abdominal discomfort, but was not kept in the hospital.  Otherwise, she has no new medical issues or symptoms.   On exam, pleasant woman in no acute  distress.  The weight is 183, unchanged since 2005.  Blood pressure 140/65, heart  rate 75 and regular, respirations 14 and unlabored.  NECK:  No jugular venous distention; bilateral carotid bruits.  ENDOCRINE:  No thyromegaly.  SKIN:  No significant lesions.  LUNGS:  Clear.  CARDIAC:  Normal first and second heart sounds; grade 1-2/6 basilar  systolic ejection murmur.  ABDOMEN:  Soft and nontender; no organomegaly.  EXTREMITIES:  2+ pitting ankle edema; distal pulses are 1-2+ and easily  obtained with Doppler.   EKG:  Normal sinus rhythm; borderline first-degree AV block; delayed R-  wave progression; minor nonspecific T-wave abnormality.  When compared  to a prior tracing of June 22, 2000:  Abnormal R-wave progression now  present; ST-T wave abnormalities have improved; criteria for LVH.  No  longer MET.   IMPRESSION:  [name] has predominately symptoms that are suggestive of  gastroesophageal reflux disease or irritable bowel up.  Since she is 14  years status post coronary artery bypass graft surgery and since she has  some accompanying chest heaviness, it is appropriate to rule out  myocardial ischemia.  A stress nuclear study will be performed.  I will  let you know the results of that test as soon as this has been completed  and plan to see this nice woman again in 1 year.  I will collect recent  laboratories from Dr. Kristian Covey and review these.    Sincerely,      Gerrit Friends. Dietrich Pates, MD, [facility]  Electronically Signed    RMR/MedQ  DD: 02/25/2009  DT: 02/26/2009  Job #: [id number]

## 2011-04-25 NOTE — Assessment & Plan Note (Signed)
NAMEMarland Kitchen  [name], [name]                CHART#:  [id number]   DATE:  08/21/2008                       DOB:  05/06/1928   REFERRING PHYSICIAN:  Franchot Heidelberg, MD   REASON FOR CONSULTATION:  Abdominal pain, nausea, vomiting, and  diarrhea.   HISTORY OF PRESENT ILLNESS:  The patient is a 77 year old female, who is  unable to tell me how long she has had intermittent abdominal pain,  nausea, vomiting, and diarrhea.  Most recent episode occurred in August  2009.  She had a CT scan, which showed edema in the gastric wall as it  passed through the diaphragm.  The CT scan was associated with pain so  bad as that it kept her up all night long.  It was right in the middle  of her abdomen and epigastrium.  She had diarrhea first, then she had  pain and she vomited twice.  She denies any blood in her vomit or in her  stool.  Sometimes she has normal formed stool and sometimes up to 2 a  day.  She denies any black tarry stool, but she is on iron, so she has  black stool because of that.  She still has a gallbladder.  She denies  any fever.  She rarely has heartburn and indigestion because she is on  Prevacid.  She does not consume any milk.  She does use cheese in  cooking or in salad.  She eats ice cream 1 to 2 times a day.   PAST MEDICAL HISTORY:  1. Diabetes.  2. Hypertension.  3. Hyperlipidemia.  4. MI x1.   She has no history of CVAs or TIAs.   PAST SURGICAL HISTORY:  1. She reports having a colon resection in 1993.  2. CABG in 1996 at [facility].   ALLERGIES:  IVP dye and adhesive tape.   MEDICATIONS:  1. Tandem or Repliva.  2. Catapres.  3. Klor-Con.  4. Lasix.  5. Zanaflex.  6. Calcium.  7. Aspirin 81 mg daily.  8. Centrum Silver.  9. Fiber therapy 2 b.i.d.  10.Evista 60 mg daily.  11.Dicyclomine as needed.  12.Benazepril.  13.Amlodipine.  14.Diltiazem.  15.Calcitriol.  16.Reglan rarely.  17.Simvastatin.  18.Prevacid 30 mg daily.  19.Zetia 10 mg daily.   FAMILY  HISTORY:  She denies any family history of colon cancer or colon  polyps.   SOCIAL HISTORY:  She is widow.  She was married over 33 years and had 1  son.  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  She quit smoking in 1992.  She denies any alcohol use.  She is able to  wash, iron, and do all of her activities of daily living.   REVIEW OF SYSTEMS:  She had an EGD and colonoscopy in 2005.  Her EGD in  2005 showed a 15 to 20-mm long Barrett segment, pseudodiverticula in  distal esophagus, moderate-sized hiatal hernia in the GE junction at 35  cm, and her colorectal anastomosis was wide open.  Her EGD in June 2008,  showed a soft stricture of the GE junction.  It was dilated to 20 mm.  Her biopsies of her GE junction showed no evidence of Barrett's and she  had a hiatal hernia. As per the HPI, otherwise all systems are negative.  PHYSICAL EXAMINATION:  VITAL SIGNS:  Weight 172 pounds, height 5 foot 6  inches (his weight down approximately 11 pounds since July 2005),  temperature 98, blood pressure 128/80, and pulse 76.GENERAL:  She is in  no apparent distress.  Alert and oriented x4.  HEENT:  Atraumatic and normocephalic.  Pupils equal and reactive to  light.  Mouth, no oral lesions.  Posterior pharynx without erythema or  exudate.NECK:  Full range of motion and no lymphadenopathy.LUNGS:  Clear  to auscultation bilaterally.  CARDIOVASCULAR:  Regular rhythm.  No murmur.  Normal S1 and S2.ABDOMEN:  Bowel sounds are present.  Soft, nontender, mildly obese, and no  distention.EXTREMITIES:  Trace to 1+ pitting edema in ankles  bilaterally.  She has some overlying erythema of her ankles bilaterally.  NEUROLOGIC:  She has no focal neurologic deficits.   LABORATORY DATA:  August 2009: normal hepatic function panel, albumin  3.9, and lipase 28.  UA negative.  Creatinine 1.24, hemoglobin 12.4,  platelets 231.   RADIOGRAPHIC STUDIES:  Per the HPI.   ASSESSMENT:  The  patient is a 77 year old female, who appears to have a  large hiatal hernia, which has vascular compromise due to the  impingement of the diaphragm. Thank you for allowing me to see the  patient in consultation.  My recommendations follow.   RECOMMENDATIONS:  1. She does not need a repeat upper endoscopy unless Dr. Franky Macho      is requested prior to having her hiatal hernia repair and I      believe, the benefit from repairing her hiatal hernia outweigh the      risk of surgery.  She had symptomatic evidence of ischemia to her      stomach due to vascular compromise from diaphragmatic impingement      on the stomach as it passes through the abdomen into the chest.  2. I spoke with Dr. Franky Macho and her paperwork is being faxed.      She is given a card and asked to call his office on Monday for an      appointment.  3. She is asked to stop the Evista for now.  4. She has a follow up appointment to see me in 4 months and will      readdress her diarrhea.        Kassie Mends, M.D.  Electronically Signed     SM/MEDQ  D:  08/21/2008  T:  08/22/2008  Job:  [id number]   cc:   Franchot Heidelberg, M.D.

## 2011-04-28 NOTE — Procedures (Signed)
   [name], [name]                         ACCOUNT NO.:  [id number]   MEDICAL RECORD NO.:  [id number]                   PATIENT TYPE:  OUT   LOCATION:  RAD                                  FACILITY:  [facility]   PHYSICIAN:  Mantador Bing, M.D.               DATE OF BIRTH:  1928-10-16   DATE OF PROCEDURE:  05/04/2003                  AGE:  77  DATE OF DISCHARGE:                              SEX:  F                                CARDIAC ULTRASOUND   CLINICAL DATA:  A 77 year old woman with prior CABG surgery, hypertension,  and edema.   M MODE MEASUREMENTS:  Aorta 2.7, left atrium 4.1, septum 1.2, posterior wall  1.0, LV diastole 4.1, LV systole 3.1.   1. Technically adequate echocardiographic study.  2. Normal left and right atrial size.  The right ventricle is prominent, but     not frankly enlarged.  No RVH.  Normal RV systolic function.  3. Mild aortic valvular sclerosis with mild anular and proximal ascending     aortic wall calcification.  4. Normal mitral valve with mild annular calcification.  5. Normal tricuspid valve; very mild tricuspid regurgitation; mild elevation     in estimated RV systolic pressure.  6. Normal pulmonic valve.  7. Normal internal dimension of the left ventricle; borderline LVH, most     prominent in the septum.  Normal regional and global LV systolic     function.  8. Normal IVC.                                               Central Bing, M.D.    RR/MEDQ  D:  05/04/2003  T:  05/04/2003  Job:  [id number]

## 2011-04-28 NOTE — Discharge Summary (Signed)
[name], [name]               ACCOUNT NO.:  [id number]   MEDICAL RECORD NO.:  [id number]          PATIENT TYPE:  INP   LOCATION:  A223                          FACILITY:  [facility]   PHYSICIAN:  Hanley Hays. Dechurch, M.D.DATE OF BIRTH:  10-11-1928   DATE OF ADMISSION:  05/07/2006  DATE OF DISCHARGE:  05/10/2006                                 DISCHARGE SUMMARY   DIAGNOSES:  1.  Hyponatremia, resolving.  2.  Acute on chronic renal insufficiency, improved.  3.  Coronary artery disease, stable.  4.  Chronic abdominal pain, multifactorial, improved.  5.  Diarrhea secondary to laxatives, improving.  6.  Remote history of thyroidectomy.  7.  Question soft tissue mass of left kidney, unremarkable on ultrasound, no      further evaluation indicated.  8.  Dyspnea, probable chronic obstructive pulmonary disease, improved.   DISPOSITION:  The patient is being discharged to home. Follow up with Dr.  Mirna Mires in one to two weeks. BMP in one week prior to her admission.   MEDICATIONS:  1.  Catapres 0.1 mg every week.  2.  Lotrel 10/20 daily.  3.  Cardizem CD daily.  4.  Sennekot as directed.  5.  Rocaltrol 0.25 mg daily.  6.  Xanax 0.25 mg daily as needed.  7.  Aciphex 20 mg daily.  8.  Levbid 0.375 mg as needed.  9.  Calcium carbonate t.i.d. with meals.  10. Aspirin 81 mg daily.  11. Centrum Silver 1 daily.  12. Citrucel b.i.d.  13. Spiriva 1 puff daily.  14. Advair 250/50 one puff b.i.d.   The patient is instructed to hold her Lasix and potassium.   HOSPITAL COURSE:  A 77 year old Caucasian female who lives alone with her  son presented to the emergency room complaining of abdominal pain, nausea  and fever. There was a question of diverticulitis. She was having some  diarrhea. She presented to the emergency room complaining of shortness of  breath and weakness which was getting worse. Upon admission, her initial  sodium was 123, creatinine was 2.1, and BUN of 28. Initial  hemoglobin was  11.9, however, with hydration fell to a low of 9.2 and is 10.4 at the time  of discharge. She had been on Lasix and potassium. It was felt that this was  probable exacerbation of her chronic renal insufficiency due to diarrhea,  poor p.o. intake and diuretic use. She was complaining of some leg pain and  underwent an ultrasound of the lower extremities which revealed no evidence  of DVT. Her chest x-ray on admission revealed COPD and cardiomegaly status  post CABG but no acute abnormalities on an AP view. There was no evidence of  congestive heart failure. 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 She had an echocardiogram which revealed normal LV size,  mild hypertrophy and normal regional and global function, some mild right  ventricular dilatation but otherwise normal. Her Lasix and potassium were  held. She was treated with IV fluids. Because of her  shortness of breath,  Advair and Spiriva were instituted. She states she felt better. On the day  prior to admission, she was complaining of poor stool output. It was felt  that she was probably more constipated than usual. Her p.o. intake had been  poor. It sounds like the patient has a degree of irritable bowel syndrome.  In any event, she was given a Fleet's enema and had been on Senokot during  the hospital stay and developed diarrhea. However, this was subsiding at the  time of discharge, and she felt much better. She felt she could manage at  home. Followup arrangements were made. It was also noted that the patient  had suspicion of a renal mass. However, followup ultrasound did not reveal  that. She was seen in consultation by Dr. Jerre Simon who concurred no further  workup, of course, is indicated. She is discharged to home in stable  condition with the plans noted above.      Hanley Hays Josefine Class, M.D.   Electronically Signed     FED/MEDQ  D:  05/10/2006  T:  05/10/2006  Job:  [id number]

## 2011-04-28 NOTE — Discharge Summary (Signed)
[name], [name]                           ACCOUNT NO.:  [id number]   MEDICAL RECORD NO.:  [id number]                   PATIENT TYPE:  INP   LOCATION:  A213                                 FACILITY:  [facility]   PHYSICIAN:  Dirk Dress. Katrinka Blazing, M.D.                DATE OF BIRTH:  12/23/1927   DATE OF ADMISSION:  05/02/2002  DATE OF DISCHARGE:  05/10/2002                                 DISCHARGE SUMMARY   DISCHARGE DIAGNOSES:  1. Incisional hernia, left lower quadrant, through colostomy incision with     incarceration.  2. Incisional hernia, epigastrium, with small bowel incarceration.  3. Coronary artery disease.  4. Hypertension, uncontrolled.  5. Hypokalemia.  6. Iron deficiency anemia.  7. Hyperglycemia.  8. Chronic obstructive lung disease with exacerbation.  9. Hyperlipidemia.  10.      Hypomagnesemia.  11.      Hypocalcemia due to hypoparathyroidism.   SPECIAL PROCEDURE:  1. Incisional hernia repair, left lower quadrant.  2. Incisional hernia repair, epigastrium.   DISPOSITION:  The patient is discharged in much improved condition.   DISCHARGE MEDICATIONS:  1. Levsin 0.125 mg a.c. and h. s.  2. Levaquin 250 mg q.d.  3. Catapres-TTS-3 q.7d.  4. Lipitor 10 mg q.h.s.  5. Ferrous sulfate b.i.d.  6. Robitussin two teaspoons q.i.d.  7. Lotrel 5/10 q.d.  8. Calcium 500 mg with vitamin D one tab t.i.d.  9. Tylenol 500 mg q.6-8h. p.r.n.   FOLLOWUP:  The patient will see Dr. Sherrie Mustache on May 27, 2002 and Dr. Jerolyn Shin C.  Katrinka Blazing on May 20, 2002.   SUMMARY:  [age]-year-old female with history of acute onset of  abdominal pain with nausea but no vomiting.  The pain started about 0300  hours on the day of admission and was intermittently severe.  She had three  diarrheal stools.  She had a diffusely tender abdomen and was afebrile.  The  patient was admitted for treatment.   PAST HISTORY:  She has history of myocardial infarction in 1996 with  coronary artery bypass graft.   She had subtotal thyroidectomy at an early  age and has had a long history of thyroid replacement with a history of  diverticulitis and perforation in 1993, status post left colectomy with  colostomy and reanastomosis four months later.  There is a history of  ischemic colitis in March of 2000 limited to the descending colon.  She had  a negative exercise stress test for ischemia in September of 1996.  Other  surgery includes appendectomy, partial thyroidectomy, bladder suspension.   MEDICATIONS:  Her only medication was aspirin one a day.   PHYSICAL EXAMINATION:  On examination, she appeared in no acute distress.  Blood pressure 174/89, pulse 80, respirations 20, weight 98.5.  HEENT:  Unremarkable.  NECK:  Supple without JVD or bruit.  CHEST:  Clear to  auscultation.  No rales, rubs, rhonchi  or wheezes.  HEART:  Irregular  rhythm, normal S1 and S2.  No gallop.  ABDOMEN:  Distended with hyperactive  bowel sounds, with moderate tenderness of the lower abdomen without palpable  masses.  EXTREMITIES:  No guarding, good pulses, no tenderness, no joint  deformity.  NEUROLOGIC:  Cranial nerves II-XII were intact.  No motor,  sensory or cerebellar deficit.   HOSPITAL COURSE:  The patient was admitted.  A medical consult was obtained  from Dr. Sherrie Mustache.  CT of the abdomen was done and this showed an upper  midline epigastric hernia with small bowel contents and a left lower  quadrant hernia through an old colostomy site with near complete  obstruction.  The patient was counseled for exploration.  After being  medically cleared, she was taken to the operating room on May 03, 2002 where  a left lower quadrant incisional hernia and the separate epigastric hernia  were repaired primarily.  She had incarcerated small bowel in each of these.  She had transient hyperglycemia during the early postoperative period but  this resolved.  She was noted to be chronically anemic and was treated with  iron  supplements.  She had evidence of chronic obstructive lung disease and  had significant hypoventilation with O2 saturation of 85% on room air; this  was treated with increased pulmonary toilet with nebulizers, _________  and  bronchodilators.  It was noted that she had significant decreased magnesium  of 0.7 and that she had a calcium which dropped to 6.  Evaluation by Dr.  Sherrie Mustache revealed that she had hypoparathyroidism with secondary hypocalcemia  and that she had hypomagnesemia.  These were treated with supplements and  she improved.  Hypertension was treated with a Catapres patch initially and  then Norvasc was added.  After her blood sugars stabilized, she was taken  off of Accu-Cheks and her blood sugars remained stable thereafter.  She had  slow return of intestinal function but did relatively well.  By the time of  the discharge, her hypocalcemia and hypokalemia and hyponatremia were  resolved.  Blood pressure was adequately controlled and was 128/86 at the  time of discharge.  She had a fairly stable though slightly prolonged course  due to her lung disease and her many concurrent medical problems which had  not been adequately addressed in years.  She was discharged on May 10, 2002  in stable, satisfactory condition.                                               Dirk Dress. Katrinka Blazing, M.D.    LCS/MEDQ  D:  07/20/2002  T:  07/24/2002  Job:  [id number]

## 2011-04-28 NOTE — H&P (Signed)
[name], [name]                         ACCOUNT NO.:  [id number]   MEDICAL RECORD NO.:  [id number]                   PATIENT TYPE:  INP   LOCATION:  A309                                 FACILITY:  [facility]   PHYSICIAN:  Dirk Dress. Katrinka Blazing, M.D.                DATE OF BIRTH:  Sep 14, 1928   DATE OF ADMISSION:  12/08/2002  DATE OF DISCHARGE:                                HISTORY & PHYSICAL   HISTORY OF PRESENT ILLNESS:  The patient is a 77 year old female who  presented to the emergency room with complaint of increasing fatigue with  profuse diarrhea.  She has been treated by Dr. Sherrie Mustache with two courses of  antibiotics dating back to December 19, 2001.  She was treated with Tequin and  Omnicef followed by Levaquin.  The patient was seen in the office on  December 05, 2002 at which time she had a 3-day history of diarrhea with  nausea and vomiting.  She felt weak.  She was placed on Imodium and  Phenergan and ______ and given clear liquid diet.  Her symptoms became worse  and she became progressively weak.  She was seen in the emergency room where  on evaluation she was noted to be orthostatic without tachycardia.  White  count was normal but serum sodium is depleted at 122.  She is felt to have  acute colitis or gastroenteritis.  Most likely etiology is Clostridium  difficile colitis related to antibiotic treatment.  She will be admitted,  started on Flagyl, and her diarrhea will be treated symptomatically.   PAST HISTORY:  She has a history of hypertension, coronary artery disease  status post coronary artery bypass graft to five vessels, has a history of  DVT, chronic obstructive lung disease, chronic hypocalcemia due to  hypoparathyroidism and chronic hyponatremia.   SURGERIES:  Coronary artery bypass graft 1996, subtotal thyroidectomy,  incisional hernia repair, sigmoid resection with colostomy, appendectomy,  and left breast biopsy.   ALLERGIES:  IVP DYE and ADHESIVE TAPE.   MEDICATIONS:  1. Catapres-TTS-1 every 7 days.  2. Lotrel 5/20 q.d.  3. Levsin 0.125 mg a.c. and h.s.  4. Lipitor 10 mg q.h.s.  5. Ferrous sulfate 325 mg b.i.d.  6. Calcium with vitamin D t.i.d.  7. Aspirin 81 mg q.d.  8. Cardizem CD 240 mg q.d.  9. Magnesium oxide 500 mg q.d.   PHYSICAL EXAMINATION:  GENERAL:  The patient looks acutely ill.  She is not  complaining of any pain but is exceptionally weak.  VITAL SIGNS:  Blood pressure is 110/50, pulse 70, respirations of 20,  temperature 98.8.  HEENT:  Unremarkable.  NECK:  Supple without JVD or bruit.  CHEST:  Diffuse coarse rhonchi; positive wheezes.  No rales.  HEART:  Regular rate and rhythm without murmur, gallop, or rub.  ABDOMEN:  Well-healed surgical scars; diffuse tenderness; hyperactive bowel  sounds.  RECTAL:  Normal;  rectal vault empty; mucus guaiac negative.  EXTREMITIES:  No cyanosis, clubbing, or edema.  NEUROLOGIC:  Nonfocal.   IMPRESSION:  1. Acute colitis with profuse diarrhea probable Clostridium difficile     colitis.  2. Resolving acute bronchitis.  3. Hyponatremia probably secondary to diarrhea and diuretic therapy.  4. Coronary artery disease presently stable.  5. Chronic obstructive lung disease stable.  6. Hypocalcemia due to chronic hypoparathyroidism.   PLAN:  The patient will be admitted and we will get routine stool cultures  and C. difficile titer.  She will be given antiemetics and analgesics as  needed, she will receive Flagyl p.o. and once we have stool specimens she  will be started on low-dose Imodium though we do not wish to constipate her.  Her lungs will be treated based on her clinical presentation.  There is no  indication that she needs further antibiotic therapy at this time.                                               Dirk Dress. Katrinka Blazing, M.D.    LCS/MEDQ  D:  12/08/2002  T:  12/09/2002  Job:  [id number]

## 2011-04-28 NOTE — H&P (Signed)
NAMECHERAE, MARTON                         ACCOUNT NO.:  [id number]   MEDICAL RECORD NO.:  [id number]                   PATIENT TYPE:  OBV   LOCATION:  A227                                 FACILITY:  [facility]   PHYSICIAN:  Annia Friendly. [name], M.D.                DATE OF BIRTH:  27-Oct-1928   DATE OF ADMISSION:  01/25/2004  DATE OF DISCHARGE:                                HISTORY & PHYSICAL   The patient is a 77 year old widowed, gravida 2, para 1, AB 1, white female,  retired employee from [place], [place] with chief complaint of  stomach pain, nausea, vomiting, and watery stools.   HISTORY OF PRESENT ILLNESS:  Stomach pain started around 0230 at home on  January 25, 2004.  The pain was described as a sharp, stabbing sensation  above and below belly button.  The patient experienced several watery dark  stools between 0230 and 0430.  History is also positive for vomiting a  watery clear liquid x 2 at 0630. Due to persistent abdominal pain, the  patient was transported to the emergency room at [facility] for  evaluation by her son.  History is negative for fever, chills, hematochezia,  and abdominal distention.  The patient ate pork and beans, stew, white  potatoes, and corn bread for supper on January 24, 2004.   PAST MEDICAL HISTORY:  1. Hypocalcemia secondary to primary hypothyroidism.  2. Coronary artery disease.  3. Hypertension.  4. Chronic anemia.  5. Diverticulosis.  6. Chronic obstructive pulmonary disease.  7. Hyperlipidemia.  8. Osteoarthritis,  9. Chronic anxiety.   Medical history is negative for diabetes, tuberculosis, cancer, cystic  fibrosis, and seizure disorder.   In the emergency room, the patient was treated with IV fluids of normal  saline, Phenergan 12.5 mg IV x 2, morphine 2 mg IV, Protonix 40 mg IV, and  other supportive measures.   MEDICATIONS:  Prescribed medications on admission were:  1. Aspirin 81 mg p.o. every day.  2. Cardizem  CD 120 mg p.o. daily.  3. Catapres patch 0.1 mg every 7 days.  4. Lasix 40 mg p.o. every other day.  5. Lasix 80 mg p.o. every other day.  6. Lipitor 40 mg p.o. every bedtime.  7. Lotrel 5/20 once capsule p.o. every day.  8. Magnesium oxide 400 mg p.o. b.i.d.  9. Os-Cal 500 plus D 2 tablet p.o. b.i.d.  10.      Potassium 20 mEq p.o. daily.  11.      Protonix 40 mg p.o. daily.  12.      Rocaltrol 0.25 mcg p.o. every day.  13.      Xanax 0.25 mg p.o. daily p.r.n.   ALLERGIES:  IVP DYE and ADHESIVE TAPE.   HABITS:  Positive former cigarette smoker (quit in 1992; smoking 5  cigarettes per day at time of cessation; started at age 21).  Positive for  social use of ethanol.   ADDITIONAL PAST MEDICAL HISTORY:  Hospitalization for electrolyte imbalance  secondary to acute gastroenteritis December 2004; pregnancy at [facility]; diverticulitis at [facility] [id number]; repair of hernia  (abdominal) at [facility] in May 2004; acute bronchitis in December  2003 at [facility]; coronary artery bypass grafting in May 1996 at  [facility].   FAMILY HISTORY:  Mother deceased at age 17 secondary to complications of  congestive heart failure; father deceased at age 74 secondary to heart  disease.  Six sisters are living, age 17 in good health, age 58 in good  health, age 35 in good health, age 82 with a history of diabetes mellitus,  age 58 in good health; age 73 in good health; one brother is living at age  76 in good health; one brother deceased at age 41 secondary to cerebral  aneurysm; one son is living at age 17 in good health.   REVIEW OF SYSTEMS:  Positive of chronic swelling of legs, chronic episodic  stiffness of knees, occasional  perioral numbness and tingling of hands.  Review of Systems is negative for hemoptysis, bleeding gums, epistaxis,  dysphagia, dysuria, gross hematuria, vaginal bleeding, leg ulcers, weight  loss, night sweats, lumps in  breasts, and discharge from breast.  Last  menstrual period occurred in the patient's late 40s.   PHYSICAL EXAMINATION:  VITAL SIGNS:  Temperature 97.9, blood pressure  117/59, pulse 90, respirations 20.  SKIN:  Warm and dry.  HEAD:  Normocephalic.  EARS:  Normal auricles.  EYES:  Lids negative for ptosis.  Sclerae white.  Pupils equal, round, and  reactive to light. Extraocular movements intact.  NOSE:  Negative for discharge.  MOUTH:  Positive upper dentures.  No teeth at bottom, no bleeding gums.  Posterior pharynx benign.  NECK:  Positive old healed horizontal surgical scar.  No thyromegaly, no  adenopathy supraclavicular no palpable nodes.  LUNGS:  Clear.  HEART:  Audible S1, S2 without murmur.  Regular rate and rhythm.  CHEST:  Positive old healed mid sternal surgical scar.  BREASTS:  No skin changes, no nodules on palpation.  Nipple erect without  discharge.  ABDOMEN:  Obese, positive old healed gastric surgical scar.  Positive bowel  sounds.  Positive for diffuse epigastric and hypogastric tenderness.  No  palpable mass.  No organomegaly.  PELVIC:  Deferred.  RECTAL:  No external lesions.  Digital exam positive stool in rectal vault.  No palpable rectal vault masses.  Stool black and guaiac negative.  The  patient is on iron.  EXTREMITIES:  Trace tibial edema.  Weak but palpable dorsalis pedis  involving right foot.  Strong left palpable dorsalis pedis.  Left knee  positive for crepitus.  No swelling, redness, or hotness of knees.  NEUROLOGIC:  Alert and oriented to person, place, and time.  Cranial nerves  II-XII appeared intact.   LABORATORY DATA:  X-ray of abdomen at 10:35 on January 25, 2004, was read  as follows.  1. Bowel gas pattern consistent with an evolving bowel obstruction versus     ileus.  No free peritoneal air.  2. Mild cardiomegaly.  No active chest disease radiographically.   White count 11.2, hemoglobin 11.3, hematocrit 33.1, platelets  248,000. Total protein 6.9, albumin 3.5, SGOT 21, SGPT 18, alkaline phosphatase 58,  total bilirubin 0.7, sodium 129, potassium 4.3, chloride 91, CO2 28, BUN 26,  creatinine 1.4.  Urinalysis: Specific gravity 1.015, pH 7.0.  No protein, no  glucose, no ketones, no bilirubin.  Nitrite negative.  No leukocyte  esterase.   IMPRESSION:  1. Primary acute gastroenteritis.  2. Chronic hypocalcemia secondary to primary hypothyroidism.  3. Coronary artery disease.  4. Hypertension.  5. Chronic anemia.  6. Diverticulosis.  7. Chronic obstructive pulmonary disease.  8. Hyperlipidemia.  9. Osteoarthritis.   PLAN:  1. N.P.O.  2. IV fluids using normal saline at 100 ml per hour x 8 hours, then 75 ml     per hour x 8 hours, then 50 ml per hour.  3. Protonix 40 mg IV daily.  4. Levsin 0.125 mg sublingual q.6h.  \  5. Repeat flat plate right upper abdomen January 25, 2003.  6. Foley catheter.  7. Measure  daily I's and O's.  8. Analgesics for pain.  9. Xanax 0.25 mg sublingual p.r.n. for pain.  10.      Stool for leukocyte, parasite stool culture.  11.      Serum amylase and lipase.  12.      Bedside commode.  13.      Lasix 20 mg IV daily.  14.      Repeat MET 7 and CBC in a.m. plus erythrocyte sedimentation rate.     ___________________________________________                                         Annia Friendly. [name], M.D.   Levonne Hubert  D:  01/25/2004  T:  01/25/2004  Job:  [id number]

## 2011-04-28 NOTE — Discharge Summary (Signed)
[name], [name]                         ACCOUNT NO.:  [id number]   MEDICAL RECORD NO.:  [id number]                   PATIENT TYPE:  INP   LOCATION:  A202                                 FACILITY:  [facility]   PHYSICIAN:  Annia Friendly. [name], M.D.                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  Chief  complaint was diarrhea.  The patient had been experiencing diarrhea since  0800 on the day of admission.  She experienced six to eight watery black  stools at the time of presentation to the emergency room.  The patient also  complained of severe lower abdominal cramping before onset of watery stools.  History is positive also for nausea and vomiting x2.  Vomitus was described  as watery, salty, and clear-like.  The patient also complained of chills and  weakness.  She did eat a sandwich 24 hours before admission with mayonnaise  on it.  However, her son indicated he ate the same type of sandwich with  mayonnaise without ill effect.  Medical history is positive for  hypertension, hyperlipidemia, coronary artery disease post surgery in 1996  at [facility] (five-vessel disease), diverticulosis, COPD, and  chronic anemia.  The patient was allergic to IVP DYE and ADHESIVE TAPE.  Past medical history is positive also for hospitalization for pregnancy at  [facility], diverticulitis at [facility] in 1993, repair of  hernia (abdominal) at [facility] in May 2004, and acute bronchitis  in December 2003 at [facility].   FAMILY HISTORY:  Mother deceased age 60 secondary to complications of  congestive heart failure.  Father deceased at age 66 secondary to heart  disease.  Six sisters living:  Age 59, good health;  age 94, good health; age  62, good health; age 59, history of diabetes mellitus; age 46, good health;  and age 65, good health.  One brother living at age 58 in good health.  One  brother deceased age 79 secondary to complications of cerebral aneurysm.  One son living at age 32 in good health.   #1 - ELECTROLYTE IMBALANCE SECONDARY TO ACUTE GASTROENTERITIS:  General  appearance revealed a medium height, medium frame, alert white female in no  apparent respiratory distress.  Skin warm and dry.  Sclerae white.  Nose  negative for discharge.  Lungs were clear.  Heart exam was within normal  limits.  Abdomen obese and demonstrated healed old midepigastric surgical  scar.  Abdomen soft and nontender all four quadrants.  Abdominal exam also  revealed old healed mid hypogastric surgical scar.  Rectal exam revealed no  external lesion.  Digital exam was positive  for stool in the rectal vault.  Stool was dark and guaiac negative.  Digital exam demonstrated no rectal  polyps or masses.  The patient was neurologically intact.  Significant  laboratories on admission were as follows:  White count 18.1, hemoglobin  10.1, hematocrit 29.5, platelets 257,000.  Sodium 126, potassium 4.2,  chloride 91, CO2 21, glucose 150, BUN 22.  A CT of the head on the day of  admission was read as no definite acute intracranial abnormalities.  Findings were compatible with extensive chronic small-vessel disease  (microvascular demyelination involving the subcortical and deep white matter  at the cerebrum).  The patient was admitted to telemetry, started on IV  fluids, restriction of diuretic, ordering of her repeat electrolytes daily,  Foley catheter, monitoring of daily I's and O's, stool sample collected for  ova and parasites and leukocytes, stool culture, IV antibiotics, and other  supportive measures.  The patient's hospital course was uneventful.  Repeat  white count on December 02, 2003, was 9.  Last white count  was 8 on December 03, 2003.  Repeat electrolytes on December 02, 2003, were as follows:  Sodium 133, potassium 3.9, chloride 99, CO2 25, BUN 25, creatinine 1.7.  Repeat electrolytes on December 05, 2003, were as follows:  Sodium 133,  potassium 4.1, chloride 95, CO2 28, BUN 20, creatinine 1.5.  Last serum  glucose of 114 on December 05, 2003.  The patient felt significantly better.  She had no complaint of weakness.  She was ambulatory in the room without a  problem such as dizziness, shortness of breath, etc.  She remained oriented  to person, place, and time throughout this hospitalization.  Stool collected  was negative for ova and parasites and leukocytes.  Stool culture was  negative.  Blood culture also negative.  IV antibiotic was stopped before  discharge.  The patient was discharged to her home on December 05, 2003.   #2 - CHRONIC HYPOCALCEMIA SECONDARY TO HYPOPARATHYROIDISM:  Examination of  neck revealed old healed surgical scar for treatment of thyromegaly.  Dr.  Kristian Covey, nephrology, was asked to see the patient initially on  hospitalization because of elevated calcium.  She was put on p.o. calcium  supplement.  A serum calcium on admission was 5.4.  Serum calcium on  December 03, 2003, was 6.3.  Repeat serum calcium on December 05, 2003, was  5.9.  In addition to calcium supplement, the patient was started on  Rocaltrol 0.25 mcg p.o. every day.  The patient will follow up with Dr.  Kristian Covey pertaining to low calcium monitoring one week after discharge from  the hospital.  She is not complaining of fatigue at the time of discharge,  weakness, constipation, etc.  Heart monitor has always demonstrated sinus  rhythm throughout this hospitalization.   #3 - HYPERTENSION:  Blood pressure on admission was 131/57.  Heart exam  revealed audible S1, S2 without murmur, regular rate and rhythm.  Lungs were clear on admission.  Acute abdominal series of the chest was read as  previous  CABG, probable COPD, cardiomegaly, and pulmonary vascular  congestion.  Also conclusion was nonspecific ileus pattern.  Calcific  density was noted in the walled-off aortic and iliac arteries.  No  infiltrate was seen.  The patient's blood pressure was managed without  problem during this hospitalization.  At times her blood pressure had to be  decreased and held.  Blood pressure on the morning of discharge was 113/64  with a pulse of  64.  The patient was seen ambulatory in her room, without  complaint of dizziness, shortness of breath, or chest pain.   #4 - CHRONIC ANEMIA:  Hemoglobin on admission was 10.1, hematocrit 29.5.  With hydration her hemoglobin decreased to 8.2 and hematocrit 24.3, with  platelet count 197,000.  The patient was given two units of typed and  crossed packed red blood cells without problem.  Last hemoglobin was 11 and  hematocrit 32.8 on December 03, 2003.  Stool was never positive for blood.  Stool always was black secondary to iron supplement.  Iron panel during this  hospitalization demonstrated the following:  Iron saturation of 13%.  The  patient was continued on iron initially on admission, but it was increased  to three times daily by Dr. Kristian Covey, nephrologist.  Hemoglobin and  hematocrit will be monitored during the first week after discharge.  The  patient will be scheduled for outpatient EGD and colonoscopy if needed with  a change in hematocrit.  She has a history of diverticular disease.  Also,  she was put on Protonix 40 mg p.o. every day.  Nexium was not used because  of a presentation of diarrhea on admission.   #5 - CORONARY ARTERY DISEASE:  The patient have a history of status post  CABG at [facility] in 1996 involving five-vessel disease.  She was  continued on one aspirin 81 mg p.o. every day.  She has not complained of  chest pain, shortness of breath, or palpitations during this  hospitalization.  She will have a scheduled routine  appointment with  cardiology as an outpatient if needed.   #6 - HYPERLIPIDEMIA:  The patient was continued on Lipitor 10 mg p.o. every  bedtime as well as cholesterol prescription.   #7 - CHRONIC OBSTRUCTIVE PULMONARY DISEASE:  The patient had stopped smoking  several years before admission.  Chest x-ray demonstrated findings  consistent with COPD changes.  Heart exam demonstrated cardiomegaly.  The  patient's lungs were essentially clear on admission and have remained clear.  She has not complained of wheezing, shortness of breath, etc.  She has bed  lower than 35 degrees without being short of breath.  Edema of leg has  decreased significantly compared to admission.   INSTRUCTIONS AT THE TIME OF DISCHARGE:  1. Diet:  Low salt, low cholesterol, one glass of milk twice a day.  2. Activity:  Increase slowly.   MEDICATIONS:  1. Magnesium oxide 400 mg p.o. b.i.d. 2. Potassium chloride 20 mEq p.o. every day.  3. Lipitor 10 mg p.o. every bedtime.  4. Lotrel 5/20, 1 capsule p.o. every day.  5. Cardizem CD 120 mg, 1 tablet p.o. every day.  6. Xanax 0.25 mg p.o. every day.  7. One aspirin, 81 mg p.o. every day.  8. Protonix 40 mg p.o. every day.  9. Lasix 40 mg p.o. every other day.  10.      Lasix 80 mg p.o. every other day.  11.      Os-Cal 500 + D 1 tablet three times a day with meals.  12.      Catapres-TTS-1 patch every seven days.  13.      Ferrous sulfate 325 mg, 1 tablet three times a day.  14.      Rocaltrol 0.25 mcg, 1 tablet p.o. every day.   Follow up with Dr. [name] x1 week.  Follow up with Dr. Kristian Covey x1 week.   FINAL PRIMARY DIAGNOSES:  1. Electrolyte  imbalance secondary to acute gastrointestinal.  2. Hypocalcemia secondary to primary hypoparathyroidism.   SECONDARY DIAGNOSES:  1. Asymptomatic coronary artery disease.  2. Hypertension.  3. Chronic anemia.  4. Diverticulosis.  5. Chronic obstructive pulmonary disease.  6. Hyperlipidemia.  7. Osteoarthritis.  8.  Chronic anxiety.   ADDENDUM:  Parathyroid hormone was 5.3.     ___________________________________________                                         Annia Friendly. [name], M.D.   Levonne Hubert  D:  12/05/2003  T:  12/05/2003  Job:  [id number]

## 2011-04-28 NOTE — Discharge Summary (Signed)
NAMELARISSA, PEGG                         ACCOUNT NO.:  [id number]   MEDICAL RECORD NO.:  [id number]                   PATIENT TYPE:  INP   LOCATION:  A309                                 FACILITY:  [facility]   PHYSICIAN:  Dirk Dress. Katrinka Blazing, M.D.                DATE OF BIRTH:  01/15/1928   DATE OF ADMISSION:  12/07/2002  DATE OF DISCHARGE:  12/12/2002                                 DISCHARGE SUMMARY   DISCHARGE DIAGNOSES:  1. Clostridium difficile colitis.  2. Acute bronchitis.  3. Hyponatremia secondary to diarrhea and diuretic therapy.  4. Coronary artery disease.  5. Chronic obstructive lung disease.  6. Hypocalcemia due to chronic hypoparathyroidism.   DISPOSITION:  The patient is discharged home in stable condition.   DISCHARGE MEDICATIONS:  1. Calcium with vitamin D 1 tablet q.i.d.  2. Flagyl 500 mg t.i.d.  3. Levsin 0.125 mg daily.  4. Lipitor 10 mg q.h.s.  5. Ferrous sulfate 325 mg b.i.d.  6. Aspirin 81 mg daily.  7. Lotrel 5/20 daily.  8. Catapres-TTS-1 every seven days.  9. Cardizem CD 240 1 daily.  10.      Magnesium oxide 400 mg daily.  11.      Tessalon Perles 100 mg daily.  12.      Claritin 1 daily.  13.      Lasix 20 mg daily.  14.      Prednisone 20 mg daily.  15.      Amantidine 100 mg b.i.d.   FOLLOW UP:  The patient will see Dr. Sherrie Mustache in the office on January 7.   HISTORY:  The patient is a 77 year old female who presented to the emergency  room with a complaint of increasing fatigue with perfuse diarrhea.  She had  been treated by Dr. Sherrie Mustache with two courses of antibiotics dating back to  December 19, 2001.  She was treated with Tequin and this followed by Levaquin.  She was seen on December 26, at which time she had a three-day course of  diarrhea with nausea and vomiting.  She felt weak.  She was placed on  Imodium and Phenergan and had clear liquids.  Her symptoms became worse and  she became progressively weaker.  She was seen in the emergency  room and was  noted to have orthostatic dizziness.  The white count was normal.  The serum  sodium was 122.  It is felt that she had acute colitis, most likely  Clostridium difficile colitis related to antibiotic treatment.  The patient  was admitted and started on Flagyl and it was decided that we would treat  her diarrhea symptomatically.  Other associated problems were hypertension,  coronary artery disease, DVT, chronic obstructive lung disease, chronic  hypocalcemia, and chronic hyponatremia.  Other specifics are given in the  admission note.   PHYSICAL EXAMINATION:  GENERAL:  The patient looked acutely ill.  She  was  exceptionally weak but did not have any other complaints.  LUNGS:  Diffuse coarse rhonchi with wheezes.  ABDOMEN:  There was diffuse tenderness with hyperactive bowel sounds.  VITAL SIGNS:  O2 saturations were 90%-95% on room air.   HOSPITAL COURSE:  On the initial day of admission, she had many episodes of  diarrhea.  C. difficile titer was sent and it was confirmed this was  positive.  Serum calcium was low and she was given increased calcium  supplements.  She also complained of some numbness and tingling in her  hands.  Magnesium was added to her oral supplementation.  Over the next two  days, she showed significant improvement.  Her diarrhea improved and she  started to feel much better and we were able to advance her diet.  She  remained stable, showed evidence of resolution of her diarrhea.  She was  discharged home on January 2 in stable condition.                                               Dirk Dress. Katrinka Blazing, M.D.    LCS/MEDQ  D:  01/26/2003  T:  01/26/2003  Job:  [id number]

## 2011-04-28 NOTE — Consult Note (Signed)
NAMEJESLYNN, [name]                         ACCOUNT NO.:  [id number]   MEDICAL RECORD NO.:  [id number]                   PATIENT TYPE:  INP   LOCATION:  A227                                 FACILITY:  [facility]   PHYSICIAN:  Lionel December, M.D.                 DATE OF BIRTH:  [birth year]   DATE OF CONSULTATION:  03/31/2004  DATE OF DISCHARGE:  01/27/2004                                   CONSULTATION   REASON FOR CONSULTATION:  Frequent diarrhea, history of iron-deficiency  anemia corrected with therapy.   HISTORY OF PRESENT ILLNESS:  [name] is a 77 year old Caucasian female who  is referred through courtesy of Dr. Mirna Mires for GI evaluation.  She  states she has had intermittent diarrhea for at least four months.  She  remembers the week of Christmas she had diarrhea.  On Valentine's day she  had diarrhea.  She is having it just about every week or a couple times a  week.  It is sudden in onset.  Bowel movements are explosive.  Stool is  loose, but it is dark or black, and she has been on iron therapy.  She has  not had any rectal bleeding.  She also experiences sharp pain across her  upper and lower abdomen with her bouts of diarrhea.  Other than that, she  has chronic soreness in her left lower quadrant.  She is felt to have IBS  and has been treated with antispasmodic without symptomatic improvement.  The patient was hospitalized in December 2004.  She had multiple problems  including hypocalcemia, anemia with heme positive stools.  She required two  units of PRBC's.  According to Dr. Jorge Ny' discharge summary, iron deficiency  was documented.  Her iron saturation was low at 13%.  She has been treated  with ferrous sulfate and her H&H is now up to 11.8 and 33.7 (March 15, 2004).  He was planning to do an EGD an a colonoscopy, but I am not sure why it was  not.  Dr. [name] has been planning to do an outpatient EGD and a colonoscopy  on her.  The patient says she has very good  appetite and has not lost any  weight.  She denies nausea and vomiting.  With these episodes of abdominal  pain and diarrhea, she does have feeling of hot and cold, but she has never  been documented to have fever.  She has not taken any antibiotics recently.   MEDICATIONS:  1. Catapres TTS one patch every week.  2. Lotrel 20 mg daily.  3. Lipitor 40 mg daily.  4. Ferrous sulfate 325 mg t.i.d.  5. Cardizem 120 mg daily.  6. Lasix 80 mg alternating with 40 mg q.o.d.  7. Calcium and vitamin D 600 mg b.i.d.  8. Magnesium oxide 250 mg p.o. b.i.d.  9. ASA 81 mg daily.  10.  Rocaltrol 25 mcg daily.  11.      Xanax 0.025 mg daily.  12.      Protonix 40 mg q.a.m.  13.      Levsin SL 1 q.i.d.  14.      Lovenox p.r.n.   PAST MEDICAL HISTORY:  1. Coronary artery disease.  Status post CABG in 1996.  2. Hypertension.  3. Hyperlipidemia.  4. Chronic obstructive pulmonary disease.  5. Sigmoid colectomy in 1993 for perforated diverticulitis.  This was a two-     stage procedure.  Her colostomy was subsequently taken down.  Surgeries     were performed by Dr. Katrinka Blazing.  She was hospitalized at [facility]     in March 2000 with abdominal pain and bloody diarrhea and seen in     consultation by [place].  She had ischemic colitis.  She has not had a     relapse.  6. Hypocalcemia felt to be secondary to hypoparathyroidism.  7. Osteoarthrosis.  8. Chronic anxiety.   PAST SURGICAL HISTORY:  1. Appendectomy.  2. Subtotal thyroidectomy for goiter.  3. Nephropexy when she was in her teens.   ALLERGIES:  IODINE, DYES RESULTING IN HEADACHES AND SKIN RASH.   FAMILY HISTORY:  Noncontributory.  Mother died of CHF at age 87.  She had  eight siblings and only one is living.  She also had 10 half brothers and  sisters.   SOCIAL HISTORY:  She is widowed.  She is retired.  She quit cigarette  smoking 13 years ago after having smoked for many years.  She used to drink  alcohol socially, but also  quit that a few years ago.  She has one son.   PHYSICAL EXAMINATION:  GENERAL APPEARANCE:  Pleasant well-developed, well-  nourished Caucasian female who is in no acute distress.  VITAL SIGNS:  Weight 183 pounds, 5 feet 6 inches, pulse 84, blood pressure  111/64, temperature 97.4.  HEENT:  Conjunctivae is pink.  Sclerae is nonicteric.  Oropharyngeal mucosa  is normal.  NECK:  Without masses or thyromegaly.  No carotid bruits are noted.  CARDIAC:  Regular rhythm.  Normal S1, S2.  Faint systolic ejection murmur at  LLSB.  LUNGS:  Clear to auscultation.  ABDOMEN:  Full.  Bowel sounds are normal.  No bruits noted.  Soft with mild  tenderness at LLQ without guarding.  No organomegaly or masses.  RECTAL:  Black stool which is guaiac negative.  She has trace edema around  her ankles, slightly more on the right side.   LABORATORY DATA:  Labs from March 15, 2004, WBC 7.4, H&H 11.8 and 33.7, MCV  19, platelets count 288,000.  Glucose 109, BUN 32, creatinine 1.5, sodium  132, potassium 4.3, chloride 89, CO2 32, calcium 9.5.   ASSESSMENT:  1. Recurrent diarrhea.  Ms. Vila's symptoms are felt to be typical of     irritable bowel syndrome.  However, given history of iron deficiency     anemia and heme positive stools, she needs to be further evaluated to     make sure she does not have low-grade colitis, or for that matter colonic     stricture, given history of complicated diverticulitis.  2. Iron deficiency anemia, well documented by Dr. [name] in December 2004, has     corrected with oral iron therapy.  She will need to be off iron for 10     days or so prior to colonoscopy.   RECOMMENDATIONS:  1.  She will stop her Levsin as well and try __________ one tablet every     morning, prescription given for 34 with refill.  2. She will hold off her iron therapy and decrease magnesium oxide dose to     250 mg p.o. daily.  I wonder if it is also contributing to her diarrhea. 3. Diagnostic  esophagogastroduodenoscopy followed by total colonoscopy to be     performed at [facility] in 10-14 days.   We would like to thank Dr. [name] for the opportunity to participate in the  care of this nice lady.      ___________________________________________                                            Lionel December, M.D.   NR/MEDQ  D:  03/31/2004  T:  04/01/2004  Job:  [id number]

## 2011-04-28 NOTE — H&P (Signed)
[name], [name]                         ACCOUNT NO.:  [id number]   MEDICAL RECORD NO.:  [id number]                   PATIENT TYPE:  INP   LOCATION:  A202                                 FACILITY:  [facility]   PHYSICIAN:  Annia Friendly. [name], M.D.                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   CHIEF COMPLAINT:  Diarrhea.   HISTORY OF PRESENT ILLNESS:  The patient has been experiencing diarrhea  since 0800 on the day of admission.  History is positive for six to eight  watery black stools at the time of her presentation to the emergency room.  The patient complains of severe lower abdominal cramping before the onset of  watery stools.  She also experienced nausea and vomiting times two.  Vomitus  is described as watery, salty and clear-like. The patient also complains of  chills and weakness.  The patient ate a sandwich yesterday with mayonnaise  on it; however, the son indicated that he ate the same type of sandwich with  mayonnaise without any ill effect.   PAST MEDICAL HISTORY:  Medical history is positive for:  1. Hypertension.  2. Hyperlipidemia.  3. Coronary artery disease status post surgery in 1996 at [facility] - five-vessel disease.  4. Diverticulosis.  5. COPD.  6. Chronic anemia.   Medical history is negative for cancer, cystic fibrosis and seizure  disorder.   MEDICATIONS:  Prescribed meds on admission include:  1. Lotrel 5/20 1 capsule p.o. everyday.  2. Lipitor 10 mg at bedtime.  3. Catapres TTS 1 patch every seven fays for blood pressure.  4. Ferrous sulfate 325 mg p.o. b.i.d.  5. Calcium 500 mg with vitamin D p.o. t.i.d.  6. Magnesium oxide 400 mg p.o. b.i.d.  7. Potassium chloride 20 mEq  p.o.   everyday.  8. Lasix 40 mg p.o. every other day.  9. Lasix 80 mg p.o. every other day.  10.      One aspirin p.o. every day.  11.      Cardizem CD 360 mg p.o. everyday.  12.      Nexium 40 mg p.o. everyday.  13.      Xanax 0.25 mg p.o. everyday.   ALLERGIES:  The patient is allergic to IVP DYE and ADHESIVE TAPE.   HABITS:  Habits are positive for former cigarette smoker, quit in 1992; the  patient was smoking five per day at the time of termination of smoking;  started at age 81.  Habits are also positive for social use of ethanol.   PAST HISTORY:  The past history is positive for:  1. Hospitalization for  pregnancy at [facility].  2. Diverticulitis at [facility] in 1993.  3. Repair of hernia (abdominal) at [facility] in May 2004.  4. Acute bronchitis in December 2003 at [facility].   FAMILY HISTORY:  Family history reveals deceased at age 69 secondary to  complications of congestive heart failure.  Father deceased at ae 85  secondary to heart disease.  Six sisters living; age 74 in good health, age  56 in good health, age 66 in good health, age 47 with a history of diabetes  mellitus, age 28 in good health, and age 53 in good health.  One brother  alive, age 86 in good health.  One brother deceased at age 19 due to a  cerebral aneurysm.  One son living, age 52, in good health.   REVIEW OF SYSTEMS:  Review of systems is positive for chronic swelling of  legs.  Episodic stiffness of knees.  Occasional perioral numbness.  Tingling  of hands.   Review of systems is negative for hemoptysis, epistaxis, dysphagia, dysuria,  gross hematuria, vaginal bleeding, leg ulcers, weight loss, night sweats,  lumps in breasts, and discharge from breasts.  Last menstrual period  occurred in the patient's late 40s.   PHYSICAL EXAMINATION:  VITAL SIGNS:  Blood pressure 131/57, pulse 83,  respirations 28 and temperature 99.0.  GENERAL:  General appearance reveals a  medium-height, medium-frame, alert  white female in no apparent respiratory distress.  HEENT:  Head; normocephalic.  Ears; normal auricles.  Eyes; lids negative  for ptosis.  Sclerae white.  Pupils round, equal and react to light.  Extraocular movements are intact.  Nose; negative for discharge.  Mouth;  positive upper dentures, mo teeth on bottom and no oral lesions.  Posterior  pharynx benign.  NECK:  Neck negative for lymphadenopathy or thyromegaly.  LUNGS:  Lungs clear.  HEART:  Audible S1 and S2 without murmur.  Regular rate and rhythm.  BREASTS:  No skin changes.  No nodules on palpation.  Nipples erect without  discharge.  CHEST:  Chest wall positive for old healed  mid sternal surgical scar.  ABDOMEN:  Abdomen obese.  Positive mid healed old epigastric surgical scar.  Soft and nontender in all four quadrants.  Positive old mid hypogastric  surgical scar.  PELVIC EXAMINATION:  Pelvic deferred.  RECTAL:  No external lesions.  Digital exam; positive stool in rectal vault.  Stool dark and guaiac negative.  No rectal vault masses on palpation.  EXTREMITIES:  Knees positive for mild crepitus.  Tibia +1 edema bilaterally.  Positive for confluent redness bilaterally.  Feet; positive pitting edema.  Nonpalpable dorsalis pedis bilaterally.  NEUROLOGIC:  Alert and oriented to person, place and time.  Cranial nerves  II-XII appear intact.   LABORATORY DATA:  White count 18.1, hemoglobin 10.1, hematocrit 29.5 and  platelets 257,00.  Total protein 7.0, albumin 3.6, SGOT 24, SGPT 23,  alkaline phosphatase 56, and total bilirubin 0.6.  Sodium 126, potassium  4.2, chloride 91, CO2 21, glucose 150, BUN 22, and calcium 5.4.  CT of the  head on the day of admission was read as no definite acute intracranial  abnormalities.  Findings compatible with chronic small vessel ischemia,  demyelination; and, feel there is a clinical sign that the patient may have an acute or subacute demyelinating process  such as ADEM and the radiologist  strongly recommended an MRI for further imaging assessment.  Abdominal  series on admission was read as cardiomegaly  and pulmonary vascular  congestion (pulmonary venous hypertension).  Sternal wire sutures and  mediastinal clips (CABG) were noted.  No infiltrates.  Scattered gas and  multiple small bowel loops without significant distention.  Small amount of  gas in colon.  No pneumatosis or free intraperitoneal air.  Calcium density  is noted in the walls of the aorta and the iliac arteries.  Suspicion for  superior mediastinal mass with displacement of trachea to the left, likely  representing a goiter with intrathoracic extension.   The radiologist stated that he was concerned about there beig a more mass  effect on the trachea and the thoracic inlet region raising the question of  increased mass effect.  Again, the findings may be due to multinodular  goiter with substernal extension.  However, other causes of the mass cannot  be excluded.  Consider CT of the chest and further inspection in that  regard.   IMPRESSION:  1. Primary electrolyte imbalance secondary to acute diarrhea.  2. Mass effect on the trachea in the thoracic inlet region by plane film.   SECONDARY DIAGNOSES:  1. Chronic anemia.  2. Coronary artery disease.  3. Chronic obstructive pulmonary disease.  4. Osteoarthritis.  5. Hyperlipidemia.  6. Hypertension.   PLAN:  1. Admit to telemetry.  2. Repeat CT of the chest.  3. IV normal saline at 100 mL/hour times eight hours, then 75 mL per hour     times eight hours, then 50 mL per hour.  4. Diet; clear liquids.  5. Cipro 400mg  IV q.12 hours.  6. Urine cultures.  7. Stool for ova and parasites.  8. Stool culture.  9. Repeat CBC with diff and MET 7 every morning times three.  10.      Lotrel 5/21 capsule p.o. everyday.  11.      Ferrous sulfate 325 mg p.o. everyday.  12.      Diltiazem 10 mg p.o. everyday.  13.       Magnesium oxide 400 mg p.o. everyday.  14.      Protonix 40 mg p.o. everyday.  15.      Lipitor 10 mg p.o. everyday.  16.      Lasix 40 mg p.o. ever other day.  17.      Lasix 80 mg p.o. every other day.  18.      Calcium supplement.     ___________________________________________                                         Annia Friendly. [name], M.D.   Levonne Hubert  D:  12/01/2003  T:  12/02/2003  Job:  [id number]

## 2011-04-28 NOTE — H&P (Signed)
[name], MEDICI               ACCOUNT NO.:  [id number]   MEDICAL RECORD NO.:  [id number]          PATIENT TYPE:  INP   LOCATION:  A223                          FACILITY:  [facility]   PHYSICIAN:  Osvaldo Shipper, MD     DATE OF BIRTH:  1928/10/07   DATE OF ADMISSION:  05/07/2006  DATE OF DISCHARGE:  LH                                HISTORY & PHYSICAL   PRIMARY CARE PHYSICIAN:  Annia Friendly. [name], MD.   GASTROENTEROLOGIST:  Lionel December, M.D.   CARDIOLOGIST:  [place] Bing, M.D. [facility]   NEPHROLOGIST:  Jorja Loa, M.D.   GENERAL SURGEON:  Elpidio Anis, M.D.   ADMISSION DIAGNOSES:  1.  Severe hyponatremia secondary to dehydration and diuretic use.  2.  Severe nausea.  3.  Abdominal pain, unclear etiology.  4.  Shortness of breath, unclear etiology.  5.  History of coronary artery disease.  6.  Hypertension.  7.  History of chronic renal insufficiency.   CHIEF COMPLAINT:  Nausea for the past three days and shortness of breath for  the past two days.   HISTORY OF PRESENT ILLNESS:  This is a very pleasant 77 year old Caucasian  female who was doing well until about Friday when she had fever up to 101.9.  She was having left abdominal pain and nausea. She went to the urgent care  center at that point, was thought to have diverticulitis, and was prescribed  two antibiotics presumably Cipro and Flagyl, though she does not know for  sure. The pain is in the left lower quadrant of the abdomen. No radiation of  the pain. She had some loose stools, but not quite watery. These were soft.  She had about five bowel movements on Friday and about 3-4 times yesterday,  but none today. She has been passing flatus, however. She has not noticed  any blood in the stool and no black stool. No fever today. No dysuria or  hematuria. She had one episode of emesis this morning which was clear in  color with no blood in it. She went to Dr. [name] office today and was told  to come to the ED.   The patient also has been having shortness of breath for the past two days.  She mentions she has been getting worse slowly. She also gives history of  some leg swelling, though no worse than before. She states that she gets  short of breath with activity, but this time she is short of breath even at  rest. She is denying any chest pain, but she does have some back pain.  There is no history of cough.   The patient denies any weight loss. She denies any other medical problems.   MEDICATIONS AT HOME:  1.  Catapres 0.1 mg topically each week.  2.  Lotrel 10/20 once a day.  3.  Possibly Cardizem-CD 120 mg once a day.  4.  Repleva supposedly for iron supplementation. She takes it for three      weeks and then skips a week.  5.  Klor-Con 20 mg once a day.  6.  Lasix 80 mg once a day.  7.  Rocaltrol possibly 0.25 mcg once a day.  8.  Xanax possibly 0.25 mg at lunch time.  9.  AcipHex 20 mg once a day.  10. Levbid 0.375 mg once a day.  11. Calcium carbonate two pills t.i.d.  12. Aspirin 81 mg daily.  13. Centrum Silver once a day.  14. Citrucel b.i.d.   She has been given Zofran in the ED with partial relief of her symptoms.   ALLERGIES:  IVP DYE and IRON-CONTAINING DYES. She says she gets hot and  breaks out.  No allergies to any other medications.   PAST MEDICAL HISTORY:  1.  Hypertension.  2.  Coronary artery disease, status post CABG in 1996.  She denies any      cardiac catheterization or any stress test since then. She follows with      Dr. Dietrich Pates. She did have an echocardiogram back in 2004 based on      records on E-chart, which showed normal LV systolic function. No other      significant abnormalities were noted.  3.  History of diverticulosis.  4.  Status post intestinal perforation with colectomy and colorectal      anastomosis done in 1993 by Dr. Katrinka Blazing.  5.  Incisional hernia repair in 2003.  6.  Thyroidectomy for unclear reasons in the past.  7.  Appendectomy when  she was 77 years of age.  8.  Some kind of renal surgery on her right kidney in 1950. I am not sure      what it was.   She gives history of possible clots in her left leg, again this is also many  years and she is not very clear on that as well. She also supposedly has  chronic obstructive pulmonary disease, dyslipidemia, osteoarthritis, chronic  anxiety. Her recent history includes a acute bronchitis in January of this  year treated with inhalers. She was admitted back in 2005 with  gastroenteritis and hyponatremia.   SOCIAL HISTORY:  Lives in [place] with her son. She quit smoking many  years ago. No alcohol use or illicit drug use. She is mostly independent  with ambulation, however does need walker and cane occasionally.   FAMILY HISTORY:  Again she mentioned no significant family history. Based on  previous records, her mother is deceased, died at age 74 of CHF. Father died  at age 18 of heart disease. Some history of diabetes and cerebral aneurysm  in her other family. No history of cancer in the family.   REVIEW OF SYSTEMS:  Positive for chronic swelling of her legs, no worse  today. Otherwise, she is complaining of some right leg pain near her calf,  also. Otherwise, quite unremarkable.   PHYSICAL EXAMINATION:  VITAL SIGNS: Temperature 98.3, blood pressure last  reading was 103/48, heart rate 75, saturation 97% on room air.  GENERAL: An elderly white female, slightly anxious, a little tachypneic, but  in no distress.  HEENT: There is no pallor or icterus. Oral mucosa is dry. No oral lesions  are noted.  NECK: Soft and supple. A scar from thyroidectomy is noted.  LUNGS: Clear to auscultation bilaterally. No rales, rhonchi, or wheezes.  CARDIOVASCULAR: Normal and regular. No murmurs appreciated. I could not  appreciate any bruits today in the carotid area. ABDOMEN: Nondistended, bowel sounds present. There is tenderness mostly in  the left lower quadrant. No rebound.  There is mild guarding. There is some  tenderness  in the right lower quadrant as well as in the epigastric area,  but most of her symptoms are in the left lower quadrant. She also has left  CVA tenderness where she mentioned her back pain.  EXTREMITIES: Chronic skin changes from edema. Mild pitting edema is noted.  No real calf tenderness appreciated at this time. Peripheral pulses are  poor, but palpable.  NEUROLOGIC: The patient is alert and oriented times three. No focal  neurologic deficits appreciated.   LABORATORY DATA:  Her CBC shows normal white count of 9.8, hemoglobin 11.9  which appears to be at her baseline, MCV 91, platelet count 306,000, 87%  neutrophils, no bands.   Sodium 123, potassium 3.4, chloride 81, bicarbonate 23, anion gap 19,  glucose 131, BUN elevated at 28, creatinine elevated 2.1.  LFTs are all  normal. Calcium is 9.1.  Lipase is 21. No UA has been done yet.   She also had a CT with PO contrast in the ED here. This showed findings  suggestive of constipation. It also showed a 2.5 cm left renal solid lesion  and ultrasound was recommended. Moderate hiatal hernia noted. No other acute  findings appreciated.   No EKG has been done and no chest x-ray.   IMPRESSION:  1.  This is a 77 year old Caucasian female with medical problems as noted      earlier, who presents with a three to four day history of nausea and      then a two day history of shortness of breath. Her GI symptoms could be      coming from gastroenteritis. She does have some evidence for      constipation, though she actually mentioned some multiple frequency of      stools over the past two days. She does have this left renal lesion      which needs to be further evaluated as she does have left CVA tenderness      as well. There is nothing acute in the abdomen that could be causing her      symptoms at this time. There is no clear cut evidence for diverticulitis      based on this CAT scan,  though she has been on antibiotics for about      three to four days now.  2.  Shortness of breath. She is not tachycardic, no chest pain, and her      oxygenation appears to be fine. This could all be anxiety. However, she      does mention some right leg pain and she sounds like she has a history      of DVTs in the past. Hence, this needs to be evaluated. She also has      coronary artery disease and this could also be anginal equivalent. Her      CABG was more than 10 years ago.  There is no clinical evidence for      pulmonary edema at this time.   PLAN:  1.  Abdominal pain. We will hydrate this patient with normal saline. We will      put her on PPI, give her antiemetics. Give her pain control. Continue      Cipro and Flagyl for now. Will do stool studies when she is able to have      a bowel movement. I think we are going to continue antibiotics because     they were started on Friday and this is day four today. She may  have had      some improvement in her findings on the CT as she was on antibiotics for      diverticulitis. This may be discontinued over the next few days.  2.  Left renal mass. Ultrasound has been ordered, which will be done      tomorrow. Will have a urology consultation also on this patient.  3.  Other renal issues. She has acute renal failure. Hopefully, this will be      corrected with IV fluids. This appears to be prerenal azotemia. The left      kidney lesion does complicate issues a little bit. However, will      continue IV fluids, hold the Lasix obviously. Will also hold some of her      antihypertensive agents as she can get hypotensive.  4.  Infectious disease. She is afebrile now. As mentioned above, will      continue antibiotics for now. Blood cultures will be done if she become      febrile.  5.  Right leg pain. Obtain venous Doppler.  6.  Shortness of breath. Will obtain an EKG, chest x-ray, check cardiac      panel, and D-dimer. Will check a BNP  also. Depending on the results      further decisions will be taken. Will give her one breathing treatment      for now. Put her on oxygen as that might make her feel a little bit      better. Will also give her one Xanax to elevate any anxiety that might      be causing her to be short of breath at this time.  7.  Cardiac issues. She does have coronary artery disease which appears to      be stable. We will monitor on telemetry because of the shortness of      breath, though she is denying any chest pain. Will get EKGs on her and      check a chest x-ray as mentioned above. Will check one cardiac panel.  8.  Check a TSH considering the history of thyroidectomy.  9.  Hypertension. Continue clonidine patch and will hold back on her Lotrel      and Cardizem for now. When she stabilizes this can be reinstituted.  10. Further management and decision will be based on the results of initial      testing and the patient's response to treatment.  11. DVT and GI prophylaxis will be also provided. The patient may or may not      need physical therapy and this will be determined during the course of      this hospitalization.      Osvaldo Shipper, MD  Electronically Signed     GK/MEDQ  D:  05/07/2006  T:  05/07/2006  Job:  [id number]   cc:   Annia Friendly. [name], MD  Fax: [phone]

## 2011-04-28 NOTE — Consult Note (Signed)
[name], OHALLORAN               ACCOUNT NO.:  [id number]   MEDICAL RECORD NO.:  [id number]          PATIENT TYPE:  INP   LOCATION:  A223                          FACILITY:  [facility]   PHYSICIAN:  Ky Barban, M.D.DATE OF BIRTH:  [birth year]   DATE OF CONSULTATION:  05/09/2006  DATE OF DISCHARGE:                                   CONSULTATION   CHIEF COMPLAINT:  Left renal mass.   HISTORY:  This 77 year old female was admitted by Dr. Rito Ehrlich because  abdominal pain of unknown etiology and during the work-up with CT scan of  the abdomen showed there is a mass in the left kidney so I was called in to  see this patient.  Patient has basically no urological complaint.  She does  have chronic renal insufficiency and sees Dr. Kristian Covey, the nephrologist.  Denies any fever, chills.  Denies any gross hematuria or any voiding  difficulty.   PAST MEDICAL HISTORY:  1.  Hypertension.  2.  Reflux.  3.  Chronic renal insufficiency.  4.  Allergies to IVP DYE.  5.  She also has history of coronary artery disease status post CABG 1996.  6.  History of diverticulosis.  7.  She had some kind of renal surgery on the right kidney 1950.   PHYSICAL EXAMINATION:  VITAL SIGNS:  Blood pressure 103/48.  Temperature is  normal.  GENERAL:  Fully conscious, alert, oriented.  Not in acute distress.  ABDOMEN:  Soft, flat.  Liver, spleen, kidneys are not palpable.  No CVA  tenderness.  PELVIC:  Not done.   LABORATORY DATA:  WBC count 9.8, hemoglobin is 11.9.  Sodium 123, potassium  3.4, chloride 81, bicarbonate 23, BUN 28, creatinine 2.1.  CT scan of the  abdomen as I mentioned above.   IMPRESSION:  1.  Left renal mass.  2.  Hyponatremia secondary to her diarrhea.   Her diarrhea and hyponatremia is being addressed by Dr. Rito Ehrlich and I am  going to just focus on this left renal mass as seen on CT scan.  She already  had a CT/renal ultrasound ordered so once I see that report then will make  further  recommendation.  She had abdominal ultrasound done.  There is no  mass in the left kidney.  Renal ultrasound basically normal except she has  bilateral cortical thinning.  No further urological work-up needed.  I  appreciate Dr. Osvaldo Shipper for this consult.      Ky Barban, M.D.  Electronically Signed     MIJ/MEDQ  D:  05/09/2006  T:  05/09/2006  Job:  [id number]   cc:   Osvaldo Shipper, MD

## 2011-04-28 NOTE — Procedures (Signed)
NAMEMICKELLE, GOUPIL               ACCOUNT NO.:  [id number]   MEDICAL RECORD NO.:  [id number]          PATIENT TYPE:  INP   LOCATION:  A223                          FACILITY:  [facility]   PHYSICIAN:  [place] Bing, M.D. [facility] OF BIRTH:  20-Sep-1928   DATE OF PROCEDURE:  05/08/2006  DATE OF DISCHARGE:                                  ECHOCARDIOGRAM   REFERRING PHYSICIAN:  Lonia Blood, M.D.   CLINICAL DATA:  A 77 year old woman with dyspnea and systolic murmur. Aorta  3.2, left atrium 4.1, septum 1.3, posterior wall 1.1, LV diastole 4.3, LV  systole 3.2.   1.  Technically adequate echocardiographic study.  2.  Mild left atrial enlargement; normal right atrium.  3.  Mild right ventricular dilatation; normal wall thickness; normal RV      systolic function.  4.  Moderate sclerosis of a trileaflet aortic valve; mild calcification of      the proximal ascending aorta; Doppler indicates minimal, if any,      stenosis.  5.  Normal pulmonic valve and proximal pulmonary artery.  6.  Normal mitral valve; mild annular calcification.  7.  Normal tricuspid valve; physiologic regurgitation; estimated RV systolic      pressure not well defined, but appears normal.  8.  Normal left ventricular size; mild hypertrophy; normal regional and      global function.  9.  Normal IVC.  10. Comparison with prior study of 05/04/2003:  No significant interval      change.      [place] Bing, M.D. [facility]  Electronically Signed     RR/MEDQ  D:  05/08/2006  T:  05/08/2006  Job:  [id number]

## 2011-04-28 NOTE — Consult Note (Signed)
[name], [name]                         ACCOUNT NO.:  [id number]   MEDICAL RECORD NO.:  [id number]                   PATIENT TYPE:  INP   LOCATION:  A202                                 FACILITY:  [facility]   PHYSICIAN:  Jorja Loa, M.D.             DATE OF BIRTH:  1928-11-27   DATE OF CONSULTATION:  12/01/2003  DATE OF DISCHARGE:                                   CONSULTATION   REPORT TITLE:  NEPHROLOGY CONSULTATION   CONSULTING PHYSICIAN:  Jorja Loa, M.D.   REFERRING PHYSICIAN:  Annia Friendly. [name], M.D.   REASON FOR CONSULTATION:  Hypocalcemia and also hyponatremia.   HISTORY OF PRESENT ILLNESS:  [name] is a 77 year old Caucasian female  with multiple medical problems including a history of hypertension, coronary  artery disease status post CABG, history of COPD, history of thyroid surgery  with hypoparathyroidism, who presently came because of diarrhea, and  generalized weakness. She was found to have severe hypocalcemia and, hence,  consultation is obtained.   According to the patient, she has been doing reasonably good, but she has  had intermittent diarrhea, followed by generalized weakness and leg cramps.  When blood work was done, her calcium was found to be around 6; hence,  consultation is called. At this moment she states that she is feeling  better. The last time she moved her bowels were this morning. She denies any  vomiting, but she has some nausea. She states that since she had abdominal  surgery she occasionally had recurrent problems with diarrhea.   PAST MEDICAL HISTORY:  As stated above, the patient has history of  hypertension, coronary artery disease status post CABG for five vessels,  history of DVT, COPD, history of hypocalcemia, history of hyponatremia,  history of hypomagnesemia.   PAST SURGICAL HISTORY:  1. History of CABG.  2. Subtotal thyroidectomy 40 years ago.  3. Sigmoid resection with colostomy.  4. History of appendectomy.  5. History of incisional hernia repair.  6. History of left breast biopsy.  7. In childhood had surgery on her kidneys. She does not remember what type     according to her. Her kidneys were down and they needed to lift it up.     Hence, not sure whether she had horseshoe kidney or some sort of problems     with her kidneys. She denies any history of kidney stone.   MEDICATIONS:  1. Calcium with vitamin D three tablets p.o. t.i.d.  2. Iron supplement.  3. Magnesium oxide 400 mg p.o. daily.  4. Lasix 80 mg p.o. daily.  5. Lotrel 5/20 mg p.o. daily.   SOCIAL HISTORY:  No history of smoking or alcohol abuse.   REVIEW OF SYSTEMS:  The patient denies fever, chills, or sweating. No  shortness of breath.  No orthopnea. She complains of some nausea, at this  moment no vomiting. She had no urgency or frequency, but she states  that her  urine output has gone significant to the low levels. She also complains of  leg edema.   PHYSICAL EXAMINATION:  GENERAL:  On examination, the patient is alert and in  no apparent distress.  VITAL SIGNS:  Temperature 97.1, blood pressure 125/59, pulse 83.  HEENT:  No conjunctival pallor. No icterus. Oral mucosa seems to be dry.  NECK:  Supple. No JVD.  CHEST:  Decreased breath sounds; otherwise, seems to be clear. No rales or  rhonchi.  HEART:  Regular rate and rhythm. No murmur.  ABDOMEN:  Soft with positive bowel sounds. Abdomen is nontender. Positive  bowel sounds.  EXTREMITIES:  She has 1+ edema.   LABORATORY DATA:  Her blood work consists of white blood cell count of 17.5,  hemoglobin 10.1, hematocrit 29.3, platelet count 256,000.  Sodium 128,  potassium 3.9, chloride 92, CO2 22, BUN 33, creatinine 2.1.  Albumin 3.6,  and calcium 6. UA reveals specific gravity 1.006. pH  5.  Blood culture at  this moment is pending.   ASSESSMENT:  1. Hypocalcemia. According to the documentation, the patient seems to have     total thyroidectomy, followed by  possibly hypocalcemia on calcium     supplement. Presently, her calcium declined possibly related to     noncompliance with her medication, because of her nausea and also     diarrhea. At this moment, etiology such as malabsorption and also vitamin     D deficiency related to above and hypomagnesemia all play some role.  At     this moment, the patient seems to be asymptomatic.  2. History of magnesemia. Not sure what the level is. The patient has been     on magnesium possibly related to her diarrhea.  3. History of coronary artery disease, status post coronary artery bypass     graft. Asymptomatic.  4. Renal insufficiency. At this moment, the patient's BUN and creatinine     seems to be high. At this moment I am not sure whether the patient had     chronic renal insufficiency. Since she has generalized arteriosclerotic     disease, possibly if the patient has bilateral unequal kidneys she might     have ischemic kidney disease from generalized arteriosclerotic disease.     She also has a history of hypertension and hypertensive nephrosclerosis.     I need to also add this in the differential diagnosis.  5. History of hypertension. Blood pressure seems to be reasonably     controlled.  6. History of colon surgery. She has occasional diarrhea; otherwise, seems     to be stable.   RECOMMENDATIONS:  I agree with calcium supplement. Will check her magnesium,  phosphorus, and also vitamin D level. We will possibly do an ultrasound of  the kidneys if the CT scan, which was done earlier, does not include the  kidneys.  Will follow the patient and increase her calcium supplement.   I will follow the patient.      ___________________________________________                                            Jorja Loa, M.D.   BB/MEDQ  D:  12/01/2003  T:  12/01/2003  Job:  [id number]   cc:   Annia Friendly. [name], M.D.  [address] [place]  [place] [address]  Fax:  [phone] 

## 2011-04-28 NOTE — Op Note (Signed)
NAME:  [name], [name]                         ACCOUNT NO.:  [id number]   MEDICAL RECORD NO.:  [id number]                   PATIENT TYPE:  AMB   LOCATION:  DAY                                  FACILITY:  [facility]   PHYSICIAN:  Lionel December, M.D.                 DATE OF BIRTH:  1927/12/13   DATE OF PROCEDURE:  04/11/2004  DATE OF DISCHARGE:                                 OPERATIVE REPORT   PROCEDURE:  Esophagogastroduodenoscopy followed by total colonoscopy.   INDICATIONS FOR PROCEDURE:  [name] is a 77 year old Caucasian female who  has recurrent diarrhea over the last four months.  She also has history of  iron deficiency which was well-documented during a hospitalization in  December when she received 2 units of PRBCs.  Outpatient workup was planned  and has not been possible until now.  Her anemia, however, has corrected  with iron therapy.  She also has chronic GERD.  She feels her symptoms are  well-controlled with therapy.  The procedure risks were reviewed with the  patient, and informed consent for the procedure was obtained.   PREOPERATIVE MEDICATIONS:  Cetacaine spray for pharyngeal topical  anesthesia, Demerol 50 mg IV, Versed 8 mg IV in divided dose.   FINDINGS:  The procedures were performed in the endoscopy suite.  The  patient's vital signs and O2 saturations were monitored during the procedure  and remained stable.   PROCEDURE #1, ESOPHAGOGASTRODUODENOSCOPY:  The patient was placed in the  left lateral recumbent position, and the Olympus videoscope was passed via  the oropharynx without any difficulty into the esophagus.   Esophagus:  The mucosa of the esophagus was normal at the proximal middle  third.  Distally, there was a 15 to 20-mm long segment of Barrett's  esophagus which was circumferential.  There was a pseudodiverticulum at the  junction of the squamous and Barrett's epithelium.  This was felt to be due  to scarring from prior esophagitis.  The proximal  margin was at 33 cm.  The  GE junction was estimated to be at 35, and the diaphragmatic hiatus was at  40.  There was a fairly wide hiatal hernia felt to be moderate-sized.   Stomach:  It was empty and distended very well with insufflation.  The folds  of the proximal stomach were normal.  Examination of the mucosa at gastric  body, antrum, pyloric channel, as well as angularis, fundus, and cardia were  normal.  The hernia was seen on this view as well.   Duodenum:  Examination of the bulb revealed normal mucosa.  The scope was  passed to the second part of the duodenum where the mucosa and folds were  normal.   On the way out, a biopsy was taken from this Barrett's segment, and the  endoscope was withdrawn.  The patient was then prepared for procedure #2.   PROCEDURE #2, COLONOSCOPY:  Rectal  examination was performed.  No  abnormality noted on external or digital exam.  The Olympus videoscope was  placed into the rectum and advanced into the region of the sigmoid colon.  The colorectal anastomosis was estimated to be about 23 to 22 cm.  It was  wide open.  The mucosa proximal to it showed loss of vascularity with some  granularity.  There was luminal narrowing, but this was not critical.  These  changes involved about 15 cm.  The mucosa above that was normal.  Somewhat  redundant colon, but the scope was passed to the cecum which was identified  by the ileocecal valve and appendiceal stump.  Pictures were taken for the  record.  As the scope was withdrawn, the colonic mucosa was once again  carefully examined, and the rest of it was normal.  A biopsy was taken from  this segment which showed changes of mild colitis.  The rectal mucosa was  normal.  The scope was retroflexed to examine the anorectal junction, and  moderate-size hemorrhoids were noted below the dentate line.  The endoscope  was straightened and withdrawn.  The patient tolerated the procedure well.   FINAL DIAGNOSES:   1. A 15 to 20-mm long Barrett's segment.  Involvement is circumferential.  2. 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  3. Moderate-sized sliding hiatal hernia.  4. Proximal margin of Barrett's is at 33.  The gastroesophageal junction was     estimated to be at 35, and the hiatus was at 40.  5. Normal examination of the stomach, first and second part of the duodenum.  6. Mild colitis involving about 15 cm of proximal sigmoid colon.  Biopsy     taken.  7. Colorectal anastomosis was wide open.  8. Moderate-size external hemorrhoids.   RECOMMENDATIONS:  1. She will continue antireflux measures and Protonix as before.  2. She will also continue Levbid, one tablet every morning.  3. I will be contacting the patient with the biopsy results and further     recommendations.      ___________________________________________                                            Lionel December, M.D.   NR/MEDQ  D:  04/11/2004  T:  04/11/2004  Job:  [id number]   cc:   Annia Friendly. [name], M.D.  [address]  [place]  [place] [address]  Fax: [phone]

## 2011-04-28 NOTE — Discharge Summary (Signed)
NAMEGENOVEVA, SINGLETON                         ACCOUNT NO.:  [id number]   MEDICAL RECORD NO.:  [id number]                   PATIENT TYPE:  INP   LOCATION:  A227                                 FACILITY:  [facility]   PHYSICIAN:  Annia Friendly. [name], M.D.                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   CHIEF COMPLAINT:  Stomach pains, nausea, vomiting and watery stools.  The  pain of stomach started around 0230 at home on January 25, 2004.  The pain  was described as sharp and stabbing sensation above-and-below her belly  button.  The patient experienced several watery stools between 0230 and  0430.  History was also positive for vomiting clear watery liquid x2 at  0630.  Due to persistent abdominal pain the patient was transported to the  emergency room at [facility] for evaluation by her son.  History  was negative for fever, chills, hematochezia and abdominal distension.  The  patient ate pork and meals, stew, white potatoes and cornbread for supper on  January 24, 2004.   MEDICAL HISTORY:  Positive for chronic hypoparathyroidism, coronary artery  disease, coronary artery disease, hypertension, chronic anemia,  diverticulosis, chronic obstructive pulmonary disease, hyperlipidemia,  osteoarthritis, and chronic anxiety.  The patient was allergic to BP dye and  adhesive tape.   HABITS:  Positive for former cigarette smoking (quit in 1992), smoking 5  cigarettes per day at the time of cessation, starting age 45 and positive  for social use of Ethanol.   PAST MEDICAL HISTORY:  1. Positive for hospitalization for electrolytes imbalance secondary to     acute gastroenteritis in December 2004 at [facility].  2. Pregnancy at [facility].  3. Diverticulitis at [facility] 1993.  4. Repair of abdominal hernia at [facility] in May 2004.  5. Acute bronchitis in December 2003 at [facility].  6. Coronary artery bypass grafting in May 1996 at [facility].   FAMILY HISTORY:  1. Mother deceased age 38 secondary to complications of congestive heart     failure.  2. Father deceased age 20 secondary to heart disease.  3. Six sisters, living:  Age 62 in good health; age 8 good health; age 13     good health; age 6 with history of diabetes mellitus; age 54 good     health; age 45 good health.  4. One brother living age 74 in good health; one brother deceased age 75     secondary to cerebral aneurysm.  5. One son, living, at age 18 in good health.   HOSPITAL COURSE BY PROBLEMS:  Problem #1:  ILEUS SECONDARY TO ACUTE  GASTROENTERITIS.  General appearance revealed an elderly, medium-height,  medium-framed, alert, white female in no apparent respiratory distress.  Vitals on admission:  Temperature 97.9, blood pressure 117/59, pulse of 90,  respirations 20.  Lungs were clear.  Heart exam revealed audible S1, S2  without murmur.  Rhythm was regular and rate within normal limits.  Abdomen  demonstrated no distention.  Abdomen was obese with old healed gastric  surgical scar.  Bowel sounds were present.  Abdominal exam revealed diffuse  epigastric tenderness without palpable masses or organomegaly.  Rectal exam  demonstrated no external lesions.  Digital demonstrated stool in rectal  vault and stool was guaiac negative.  Digital rectal exam revealed no  palpable masses.  X-ray of abdomen at 1035 on January 25, 2004 was read as follows:  Bowel  gas pattern consistent with an evolving bowel obstruction versus ileus.  No  free air, mild cardiomegaly, and no active chest disease radiographically.   Labs on admission were as follows:  White count of 11.2, hemoglobin 11.5,  hematocrit  33.1, platelets 248,000.  Sodium 129, potassium 4.3, chloride 91,  CO2 of 28, glucose 149, BUN 26, creatinine 1.4.  Total protein 6.8, albumin  3.5, AST 21, ALT 18, ALP  58, total bilirubin 0.7, serum amylase 142, serum  lipase 49.  Urinalysis:  Specific gravity 1.015, pH 7.0, no glucose, no  hemoglobin, no bilirubin, no ketones, no protein, nitrate negative.   In the emergency room the patient was treated with IV normal saline at 100  cc per hour, Phenergan 12.5 mg IV x2, morphine 2 mg IV and Protonix 40 mg  IV.  Because of presenting symptoms, past history of abdominal surgery and x-  ray reading the patient was admitted for conservative medical treatment  initially; and surgical consult, if needed.  The patient was initially made  NPO, continue IV fluids, IV Protonix 40 mg daily, order of repeat flat plate  and upright abdomen in 24 hours, stool for leukocyte and parasites, stool  culture, Foley catheter x24 hours.  Orders for repeat MET-7 and CBC in 24  hours, and other supportive measures.   The patient's hospital course was uneventful.  Repeat x-ray of abdomen on  January 26, 2004 was read as mild progressive small bowel dilatation in the  upper right abdomen with few air-fluid level and increased gas and normal  caliber colon.  A combination of the findings could represent  gastroenteritis or mild partial small bowel obstruction as read by Dr.  Beckie Salts.  The patient's diet was advanced to clear liquids on January 26, 2004.  Activity was increased on January 26, 2004 at __________ Margo Aye.  The patient had a solid brown bowel movement on January 27, 2004.  She did  not experience any diarrhea, nausea or vomiting during this hospitalization.  Repeat x-ray of the abdomen on January 27, 2004 was read as decreased small-  bowel distention since previous exam compatible with resolving ileus.  No  bowel wall thickening or evidence of free air.  Scattered vascular calcification and  degenerative changes of spine unchanged.  Conclusion was  resolving ileus.  The patient was discharged home on January 27, 2004.  Diet was bland.   Repeat white count on January 26, 2004 was 7500.  Repeat electrolytes on  January 26, 2004 were as follows:  Sodium 134, potassium 4.0, chloride 101,  CO2 25, glucose 101, BUN 21, creatinine 1.3.   Problem #2:  CORONARY  ARTERY DISEASE.  The patient denied experiencing any  chest pain or shortness of breath during this hospitalization.  She is never  required a cardiology consult.  She was resumed on Cardizem CD 120 mg p.o.  every day on January 26, 2004.  Moreover, she was restarted on her Lasix 40  mg p.o. every other day and 80 mg p.o. every other day; and potassium 20 mEq  p.o. every day.  The patient has been evaluated by cardiology within the  past 60 days before this admission.   Problem #3:  HYPERTENSION.  Blood pressure on admission 117/59.  Lungs were  clear on this admission.  X-ray of the chest on admission demonstrated a  mild cardiomegaly.  The patient was treated with Catapres patch 0.1 mg every  7 days, diuretic, and calcium channel blocker.  Blood pressure was  controlled during this hospitalization without problem. The patient did have  some edema of her lower extremities at the time of admission.  Blood  pressure reading on the day of discharge at 110/55 and pulse was 73.  She  had no complaint of chest pain, shortness of breath, or palpitations.   Problem #4:  CHRONIC ANEMIA.  Hemoglobin on admission was 11.5, hematocrit  33.1.  After hydration the reading for hemoglobin was 10.0, hematocrit 29.6.  The patient was on iron before admission.  She was discharged home on iron.  Rectal exam demonstrated guaiac-negative stool on this admission. The  patient will also continue on Protonix as an outpatient; as well as during  this hospitalization.   Problem #5:  DIVERTICULOSIS.  The patient required no intubation.    Problem #6:  CHRONIC OBSTRUCTIVE PULMONARY DISEASE.  The patient is a former  smoker.  Lungs were clear on auscultation.  Chest x-ray demonstrated no  active disease.  The patient required no intubation.   Problem #7:  HYPERLIPIDEMIA.  The patient was discharged home on Lipitor 40  mg p.o. at bedtime as well as low cholesterol diet.  Liver function was well  within normal range during this hospitalization and will be repeated  periodically.  The patient had no complaint of muscle aches or complaint of  arm and leg pain.   Problem #8:  OSTEOARTHRITIS.  The patient was not complaining of joint pain,  joint swelling, joint redness or hotness during this hospitalization.  She  was advised to take Tylenol 500 mg p.o. q.8h. as needed for pain.   Problem #9:  CHRONIC ANXIETY.  The patient's mood was cooperative.  She was  alert and oriented to person, place and time.  She was given Xanax 0.25 mg  sublingual during this hospitalization p.r.n.Marland Kitchen  She did not experience any visual, tactile or auditory hallucination.     Problem #10:  HYPOCALCEMIA.  Calcium on admission was 7.9.  The patient is  being managed by nephrologist, Dr. Kristian Covey, pertaining to her chronic low  calcium due to primary hypothyroidism.  She will follow up with him as  outpatient.   INSTRUCTIONS AT THE TIME OF DISCHARGE:  1. Diet:  Low salt, low cholesterol.  2. Activity:  As tolerated.   DISCHARGE MEDICATIONS:  1. Catapres 0.1 mg 1 patch every 7 days.  2. Cardizem CD 120 mg p.o. every day.  3. One aspirin 81 mg p.o. every day.  4. Lasix 40 mg p.o. every other day.  5. Lasix 80 mg p.o. every other day.  6. Lipitor 40 mg p.o. every bedtime.  7. Lotrel 5/20 one  capsule p.o. every day.  8. Magnesium oxide 400 mg p.o. b.i.d.  9. Os-Cal 500 Plus D 2 tablets twice a day.  10.      Potassium chloride 20 mEq p.o. every day.  11.      Protonix 40 mg p.o. daily.  12.      Rocaltrol 0.25 mcg 1 tablet p.o. every day.  13.       Xanax 0.25 mg p.o. every day p.r.n.  14.      Ferrous sulfate 325 mg 3 tablets p.o. every day.   FOLLOW UP:  The patient will follow up with primary care physician in 1  week.   PRIMARY DIAGNOSIS:  Acute ileus secondary to an acute gastroenteritis.   SECONDARY DIAGNOSES:  1. Hypocalcemia secondary to primary hypoparathyroidism.  2. Coronary artery disease.  3. Hypertension.  4. Chronic anemia.  5. Diverticulosis.  6. Chronic obstructive pulmonary disease.  7. Hyperlipidemia.  8. Osteoarthritis.  9. Chronic anxiety.     ___________________________________________                                         Annia Friendly. [name], M.D.   Levonne Hubert  D:  01/28/2004  T:  01/28/2004  Job:  [id number]

## 2011-04-28 NOTE — Op Note (Signed)
NAMEMEKIYAH, GLADWELL                           ACCOUNT NO.:  [id number]   MEDICAL RECORD NO.:  [id number]                   PATIENT TYPE:  INP   LOCATION:  A213                                 FACILITY:  [facility]   PHYSICIAN:  Dirk Dress. Katrinka Blazing, M.D.                DATE OF BIRTH:  1928-10-10   DATE OF PROCEDURE:  05/03/2002  DATE OF DISCHARGE:  05/10/2002                                 OPERATIVE REPORT   PREOPERATIVE DIAGNOSES:  1. Ventral incisional hernia with incarceration.  2. Left lower quadrant incisional hernia.  3. Old colostomy site with incarceration.   POSTOPERATIVE DIAGNOSES:  1. Ventral incisional hernia with incarceration.  2. Left lower quadrant incisional hernia.  3. Old colostomy site with incarceration.   PROCEDURE:  1. Incisional hernia repair left lower quadrant.  2. Incisional hernia repair epigastrium.   SURGEON:  Dr. Elpidio Anis.   DESCRIPTION OF PROCEDURE:  Under general anesthesia, the patient's abdomen  was prepped and draped in a sterile field. A midline incision was made  through the old incision. There was a large hernia through the upper midline  incision through an opening created by mediastinal drains. The small bowel  and omental fat protruded through the fascial opening though not tightly  incarcerated. There were adhesions of the abdominal cavity. There was a  large incisional hernia in the left lower quadrant through her old colostomy  site. Most of the hernia was subfascial with a small opening in the entire  thickness  of the fascia creating a small hernia neck with incarcerated  small bowel. The left lower quadrant hernia was repaired initially. A  transverse incision was created in the left lower quadrant. The neck of the  hernia sac was enlarged back enlarging the fascia The sac was opened. It  contained a loop of small bowel which was edematous and reddened but not  vascular compromised. The bowel was reduced through the hole. The fascia  was  closed in layers using #0 Prolene and #1 Prolene. The subcutaneous tissue  was closed with 2-0  Biosyn and skin was closed with staples. Next, the  defect in the upper abdomen was addressed. The attenuated fascia was  removed. The small bowel and omental fat were inspected and appeared to be  viable. It was felt that this could be closed primarily. The defect and the  remaining portion of the incision was closed with interrupted #1 Prolene.  The subcutaneous tissue was irrigated and then closed with 2-0 Biosyn. The  skin was closed with staples. The patient tolerated the procedure well. A  dressing was placed. She was awakened from anesthesia uneventfully,  transferred to a bed and taken to the post anesthesia care unit.  Dirk Dress. Katrinka Blazing, M.D.   LCS/MEDQ  D:  07/20/2002  T:  07/22/2002  Job:  [id number]

## 2011-05-19 ENCOUNTER — Encounter: Payer: Self-pay | Admitting: Cardiology

## 2011-05-24 ENCOUNTER — Ambulatory Visit (INDEPENDENT_AMBULATORY_CARE_PROVIDER_SITE_OTHER): Payer: Medicare Other | Admitting: Cardiology

## 2011-05-24 ENCOUNTER — Encounter: Payer: Self-pay | Admitting: Cardiology

## 2011-05-24 ENCOUNTER — Other Ambulatory Visit: Payer: Self-pay | Admitting: Cardiology

## 2011-05-24 DIAGNOSIS — I2510 Atherosclerotic heart disease of native coronary artery without angina pectoris: Secondary | ICD-10-CM

## 2011-05-24 DIAGNOSIS — I70209 Unspecified atherosclerosis of native arteries of extremities, unspecified extremity: Secondary | ICD-10-CM

## 2011-05-24 DIAGNOSIS — R0989 Other specified symptoms and signs involving the circulatory and respiratory systems: Secondary | ICD-10-CM

## 2011-05-24 DIAGNOSIS — K5792 Diverticulitis of intestine, part unspecified, without perforation or abscess without bleeding: Secondary | ICD-10-CM | POA: Insufficient documentation

## 2011-05-24 DIAGNOSIS — I739 Peripheral vascular disease, unspecified: Secondary | ICD-10-CM

## 2011-05-24 DIAGNOSIS — K2270 Barrett's esophagus without dysplasia: Secondary | ICD-10-CM

## 2011-05-24 DIAGNOSIS — K5732 Diverticulitis of large intestine without perforation or abscess without bleeding: Secondary | ICD-10-CM

## 2011-05-24 DIAGNOSIS — E785 Hyperlipidemia, unspecified: Secondary | ICD-10-CM

## 2011-05-24 DIAGNOSIS — I679 Cerebrovascular disease, unspecified: Secondary | ICD-10-CM

## 2011-05-24 MED ORDER — ATORVASTATIN CALCIUM 40 MG PO TABS: 40.0000 mg | ORAL_TABLET | Freq: Every day | ORAL | Status: DC

## 2011-05-24 NOTE — Progress Notes (Signed)
HPI: [name] is a very pleasant 77 y/o CF patient of Dr. Dietrich Pates who we are seeing on annual follow up with know history of CAD, CABG, Hypertension, carotid artery disease, hypercholesterolemia.  She has had no hospitalizations or new diagnosis.  She has had recurrent shingles over the last year with 3 episodes.  She has had recent cholesterol studies in May of 2012 along with BMET.  She is without complaint with the exception of intermittent leg pain, more noticeable at rest but also when she is walking. She sometimes feels as if her left leg is is not as strong.  Otherwise she is doing well and remains active for her age.  Allergies  Allergen Reactions  . Doxycycline Hyclate     REACTION: Nausea, epigastric pain  . Iohexol Rash    Premedication required   . Other Rash    Adhesive Tape    Current Outpatient Prescriptions  Medication Sig Dispense Refill  . acetaminophen (TYLENOL) 500 MG tablet Take 500 mg by mouth every 6 (six) hours as needed.        . ALPRAZolam (XANAX) 0.25 MG tablet Take 0.25 mg by mouth at bedtime as needed.        Marland Kitchen amLODipine (NORVASC) 5 MG tablet Take 5 mg by mouth daily.        Marland Kitchen aspirin 81 MG tablet Take 81 mg by mouth daily.        Marland Kitchen atorvastatin (LIPITOR) 20 MG tablet Take 20 mg by mouth daily.        . benazepril (LOTENSIN) 40 MG tablet Take 40 mg by mouth daily.        . calcitRIOL (ROCALTROL) 0.25 MCG capsule Take 0.25 mcg by mouth daily.        . calcium carbonate (OS-CAL) 600 MG TABS Take 3,600 mg by mouth daily.        . cloNIDine (CATAPRES - DOSED IN MG/24 HR) 0.1 mg/24hr patch Place 1 patch onto the skin once a week.        . dicyclomine (BENTYL) 10 MG capsule Take 10 mg by mouth 4 (four) times daily -  before meals and at bedtime.        Marland Kitchen diltiazem (DILACOR XR) 120 MG 24 hr capsule Take 120 mg by mouth daily.        Marland Kitchen FIBER COMPLETE PO Take 2 tablets by mouth 2 (two) times daily.        . furosemide (LASIX) 80 MG tablet Take 80 mg by mouth as  needed.        . lansoprazole (PREVACID) 30 MG capsule Take 30 mg by mouth daily.        . Multiple Vitamins-Minerals (CENTRUM SILVER PO) Take 1 tablet by mouth daily.        . potassium chloride (KLOR-CON) 20 MEQ packet Take 20 mEq by mouth daily as needed.        . Prenatal Vit-Fe Psac Cmplx-FA (POLY IRON PN PO) Take 1 tablet by mouth daily.        . promethazine (PHENERGAN) 25 MG tablet Take 25 mg by mouth every 6 (six) hours as needed.        . raloxifene (EVISTA) 60 MG tablet Take 60 mg by mouth daily.        . traMADol (ULTRAM) 50 MG tablet Take 50 mg by mouth every 8 (eight) hours as needed.          Past Medical History  Diagnosis Date  .  Barrett's esophagus 2005    2008: EGD/NUR-EROSIVE ESOPHAGITIS;  . Irritable bowel syndrome   . Diverticulitis     required colostomy-1993  . Hypertension   . Coronary artery disease     CABG surgery in 5/96; non-Q MI with normal EF in 04/2003; negative stress nuclear in 02/2009  . Anxiety   . Hyperlipidemia   . Osteoporosis   . Cerebrovascular disease     Bilat. bruits; no focal disease 2000-2004; 50-69% R+ L ICA stenosis in 6/09 +plaque  . Tobacco abuse, in remission     Quit in 1992  . Chronic kidney disease (CKD), stage III (moderate)     with hypoparathyroidism    Past Surgical History  Procedure Date  . Colostomy 1993    and reversal; ruptured diverticulum  . Colonoscopy 2005  . Coronary artery bypass graft 2006    x5  . Ventral hernia repair     following incarceration with a small bowel obstruction  . Appendectomy   . Tonsillectomy   . Thyroidectomy, partial     Goiter  . Breast excisional biopsy     Benign mass  . Kidney surgery Age 88    ZOX:WRUEAV of systems complete and found to be negative unless listed above PHYSICAL EXAM BP 155/68  Pulse 79  Ht 5\' 6"  (1.676 m)  Wt 159 lb (72.122 kg)  BMI 25.66 kg/m2  SpO2 94% General: Well developed, well nourished, in no acute distress Head: Eyes PERRLA, No xanthomas.    Normal cephalic and atramatic  Lungs: Clear bilaterally to auscultation and percussion. Heart: HRRR S1 S2, with 1/6 systolic murmur with preserved S2.  Pulses are 2+ & equal.            Right  carotid bruit, none on the left. No JVD.  No abdominal bruits.  Abdomen: Bowel sounds are positive, abdomen soft and non-tender without masses or                  Hernia's noted. Msk:  Back normal, normal gait. Normal strength and tone for age. Extremities: No clubbing, cyanosis Mild bilateral ankle edema.  Bilateral femoral bruits, unable to palpate Left DP, PT or Pop.  Doppler does auscultate this however as a single sound.  Neuro: Alert and oriented X 3. Psych:  Good affect, responds appropriately   ASSESSMENT AND PLAN

## 2011-05-24 NOTE — Assessment & Plan Note (Signed)
Review of cholesterol studies completed in May of 2012 demonstrated TC 229;TG 142; LDL 131; and HDL 70.  Will increase avorvastatin to 40 mg. Will recheck status in one month with lipids.

## 2011-05-24 NOTE — Assessment & Plan Note (Signed)
I have continued to hear carotid bruits. Last check of carotids was completed in July of 2011.  This demonstrated bilateral carotid bifurcation and proximal ICA plaque resulting in no more than 50-69% diameter stenosis.  We will repeat this study for careful follow-up of progression of disease.   She also having some mild intermittent claudication symptoms, most on the left where she sometimes feels weakness there when walking.  I have not been able to palpate DP or PT pulses in the left leg, and do hear bilateral femoral bruits.  Dopplers demonstrate audible sound.  Will do ABI's to evaluate for evidence of PAD.  More recommendations per Dr. Dietrich Pates based upon his review of results.

## 2011-05-24 NOTE — Assessment & Plan Note (Signed)
The patient has been seen and examined by me and by Dr. Dietrich Pates. She is doing well from a cardiac standpoint without any new symptoms.  She will continue on current medication regimen and be seen again in one year.

## 2011-05-24 NOTE — Patient Instructions (Signed)
Your physician has requested that you have an ankle brachial index (ABI). During this test an ultrasound and blood pressure cuff are used to evaluate the arteries that supply the arms and legs with blood. Allow thirty minutes for this exam. There are no restrictions or special instructions.  Your physician has requested that you have a carotid duplex. This test is an ultrasound of the carotid arteries in your neck. It looks at blood flow through these arteries that supply the brain with blood. Allow one hour for this exam. There are no restrictions or special instructions.  Your physician has recommended you make the following change in your medication: increase Lipitor to 40mg  nightly  Your physician recommends that you return for lab work in: 1 month  Your physician recommends that you schedule a follow-up appointment in: 1 year

## 2011-05-26 ENCOUNTER — Ambulatory Visit (HOSPITAL_COMMUNITY)
Admission: RE | Admit: 2011-05-26 | Discharge: 2011-05-26 | Disposition: A | Discharge status: Home / Self Care | Payer: Medicare Other | Source: Ambulatory Visit | Attending: Cardiology | Admitting: Cardiology

## 2011-05-26 DIAGNOSIS — R0989 Other specified symptoms and signs involving the circulatory and respiratory systems: Secondary | ICD-10-CM

## 2011-05-26 DIAGNOSIS — I10 Essential (primary) hypertension: Secondary | ICD-10-CM | POA: Insufficient documentation

## 2011-05-26 DIAGNOSIS — I739 Peripheral vascular disease, unspecified: Secondary | ICD-10-CM | POA: Insufficient documentation

## 2011-05-26 DIAGNOSIS — E119 Type 2 diabetes mellitus without complications: Secondary | ICD-10-CM | POA: Insufficient documentation

## 2011-05-26 DIAGNOSIS — F17200 Nicotine dependence, unspecified, uncomplicated: Secondary | ICD-10-CM | POA: Insufficient documentation

## 2011-06-21 LAB — LIPID PANEL
HDL: 53 mg/dL (ref >39)
LDL Cholesterol: 107 mg/dL — ABNORMAL HIGH (ref 0–99)
Total CHOL/HDL Ratio: 3.7 Ratio
Triglycerides: 173 mg/dL — ABNORMAL HIGH (ref <150)

## 2011-06-27 ENCOUNTER — Encounter: Payer: Self-pay | Admitting: *Deleted

## 2011-06-27 ENCOUNTER — Telehealth: Payer: Self-pay | Admitting: Cardiology

## 2011-06-27 DIAGNOSIS — E785 Hyperlipidemia, unspecified: Secondary | ICD-10-CM

## 2011-06-27 MED ORDER — ATORVASTATIN CALCIUM 80 MG PO TABS: 80.0000 mg | ORAL_TABLET | Freq: Every day | ORAL | Status: DC

## 2011-06-27 NOTE — Telephone Encounter (Signed)
RESULTS LETTER TO PT, LABS ORDERED AND MEDS ALSO ORDERED HANDOUTS ON LDL, TRIGLYCERIDES AND LOW CHOLESTEROL DIET

## 2011-06-27 NOTE — Telephone Encounter (Signed)
Patient states she was talking to you but forgot 1 question / tg

## 2011-06-28 ENCOUNTER — Telehealth: Payer: Self-pay | Admitting: Cardiology

## 2011-06-28 NOTE — Telephone Encounter (Signed)
Pt may have labs drawn at PCP's office on 07/28/2011

## 2011-06-28 NOTE — Telephone Encounter (Signed)
Patient wants to know if she can have bloodwork done @ PCP on 8/13/tg

## 2011-09-04 LAB — URINALYSIS, ROUTINE W REFLEX MICROSCOPIC
Glucose, UA: NEGATIVE
Protein, ur: NEGATIVE
Specific Gravity, Urine: 1.01

## 2011-09-04 LAB — URINE CULTURE

## 2011-09-04 LAB — URINE MICROSCOPIC-ADD ON

## 2011-09-26 LAB — COMPREHENSIVE METABOLIC PANEL
ALT: 18
AST: 25
Alkaline Phosphatase: 49
CO2: 26
Chloride: 93 — ABNORMAL LOW
GFR calc Af Amer: 59 — ABNORMAL LOW
GFR calc non Af Amer: 49 — ABNORMAL LOW
Glucose, Bld: 140 — ABNORMAL HIGH
Sodium: 129 — ABNORMAL LOW
Total Bilirubin: 0.6

## 2011-09-26 LAB — URINALYSIS, ROUTINE W REFLEX MICROSCOPIC
Bilirubin Urine: NEGATIVE
Hgb urine dipstick: NEGATIVE
Nitrite: NEGATIVE
Specific Gravity, Urine: 1.01
Urobilinogen, UA: 0.2
pH: 7

## 2011-09-26 LAB — DIFFERENTIAL
Basophils Absolute: 0
Basophils Relative: 0
Eosinophils Absolute: 0.1
Eosinophils Relative: 1
Lymphs Abs: 0.9
Neutrophils Relative %: 87 — ABNORMAL HIGH

## 2011-09-26 LAB — CBC
Hemoglobin: 12.5
RBC: 4.19
WBC: 11.4 — ABNORMAL HIGH

## 2011-09-26 LAB — LIPASE, BLOOD: Lipase: 21

## 2012-01-17 ENCOUNTER — Other Ambulatory Visit: Payer: Self-pay | Admitting: Cardiology

## 2012-05-14 ENCOUNTER — Ambulatory Visit (HOSPITAL_COMMUNITY)
Admission: RE | Admit: 2012-05-14 | Discharge: 2012-05-14 | Disposition: A | Discharge status: Home / Self Care | Payer: Medicare Other | Source: Ambulatory Visit | Attending: Internal Medicine | Admitting: Internal Medicine

## 2012-05-14 DIAGNOSIS — R262 Difficulty in walking, not elsewhere classified: Secondary | ICD-10-CM | POA: Insufficient documentation

## 2012-05-14 DIAGNOSIS — IMO0001 Reserved for inherently not codable concepts without codable children: Secondary | ICD-10-CM | POA: Insufficient documentation

## 2012-05-14 DIAGNOSIS — E785 Hyperlipidemia, unspecified: Secondary | ICD-10-CM | POA: Insufficient documentation

## 2012-05-14 DIAGNOSIS — I10 Essential (primary) hypertension: Secondary | ICD-10-CM | POA: Insufficient documentation

## 2012-05-14 DIAGNOSIS — M25569 Pain in unspecified knee: Secondary | ICD-10-CM | POA: Insufficient documentation

## 2012-05-14 DIAGNOSIS — M6281 Muscle weakness (generalized): Secondary | ICD-10-CM | POA: Insufficient documentation

## 2012-05-14 DIAGNOSIS — M25559 Pain in unspecified hip: Secondary | ICD-10-CM | POA: Insufficient documentation

## 2012-05-14 NOTE — Evaluation (Signed)
Physical Therapy Evaluation  Patient Details  Name: [name] MRN: [id number] Date of Birth: [birth year]  Today's Date: 05/14/2012 Time: 3086-5784 PT Time Calculation (min): 46 min  Visit#: 1  of 12   Re-eval: 06/28/12 Assessment Diagnosis: difficulty walking Next MD Visit: 07/11/12 Prior Therapy: none  Authorization: medicare    Past Medical History:  Past Medical History  Diagnosis Date  . Barrett's esophagus 2005    2008: EGD/NUR-EROSIVE ESOPHAGITIS;  . Irritable bowel syndrome   . Diverticulitis     required colostomy-1993  . Hypertension   . Coronary artery disease     CABG surgery in 5/96; non-Q MI with normal EF in 04/2003; negative stress nuclear in 02/2009  . Anxiety   . Hyperlipidemia   . Osteoporosis   . Cerebrovascular disease     Bilat. bruits; no focal disease 2000-2004; 50-69% R+ L ICA stenosis in 6/09 +plaque  . Tobacco abuse, in remission     Quit in 1992  . Chronic kidney disease (CKD), stage III (moderate)     with hypoparathyroidism   Past Surgical History:  Past Surgical History  Procedure Date  . Colostomy 1993    and reversal; ruptured diverticulum  . Colonoscopy 2005  . Coronary artery bypass graft 2006    x5  . Ventral hernia repair     following incarceration with a small bowel obstruction  . Appendectomy   . Tonsillectomy   . Thyroidectomy, partial     Goiter  . Breast excisional biopsy     Benign mass  . Kidney surgery Age 78    Subjective Symptoms/Limitations Symptoms: [name] states that her legs cramp constantly.  She states that she has noticed that she has been losing strength in her ankles and knees for over a year now.  She states in the past few months she has noticed that  her knee and ankle are going to give way.  She mentioned this to her MD who has referred her to therapy. Pertinent History: boardline diabetic. How long can you sit comfortably?: The patient states that she is able to sit for an hour. How long can  you stand comfortably?: The patient states that she is able to stand for five minutes before she feels like she has to move or sit down.  If she can move around to make a meal she could stay up for 15 minutes. How long can you walk comfortably?: The patient is able to walk for five to ten minutes without stopping when she feels like she has gave out.  Precautions/Restrictions   falls  Prior Functioning  Home Living Lives With: Son Home Access: Ramped entrance Prior Function Vocation: Retired Leisure: Hobbies-no  Cognition/Observation Cognition Overall Cognitive Status: Appears within functional limits for tasks assessed  Sensation/Coordination/Flexibility/Functional Tests    Assessment RLE Strength Right Hip Flexion: 2+/5 Right Hip Extension: 3-/5 Right Hip ABduction: 2/5 Right Hip ADduction: 2/5 Right Knee Flexion: 5/5 Right Knee Extension: 4/5 Right Ankle Dorsiflexion: 4/5 LLE Strength Left Hip Flexion: 2/5 Left Hip Extension: 2+/5 Left Hip ABduction: 2/5 Left Hip ADduction: 2/5 Left Knee Flexion: 5/5 Left Knee Extension: 4/5 Left Ankle Dorsiflexion: 4/5  Exercise/Treatments Mobility/Balance  Berg Balance Test Sit to Stand: Able to stand without using hands and stabilize independently Standing Unsupported: Able to stand safely 2 minutes Sitting with Back Unsupported but Feet Supported on Floor or Stool: Able to sit safely and securely 2 minutes Stand to Sit: Controls descent by using hands Transfers: Able to  transfer safely, definite need of hands Standing Unsupported with Eyes Closed: Able to stand 10 seconds safely Standing Ubsupported with Feet Together: Able to place feet together independently and stand for 1 minute with supervision From Standing, Reach Forward with Outstretched Arm: Can reach forward >5 cm safely (2") From Standing Position, Pick up Object from Floor: Able to pick up shoe, needs supervision From Standing Position, Turn to Look Behind Over  each Shoulder: Turn sideways only but maintains balance Turn 360 Degrees: Able to turn 360 degrees safely but slowly Standing Unsupported, Alternately Place Feet on Step/Stool: Able to stand independently and complete 8 steps >20 seconds Standing Unsupported, One Foot in Front: Able to take small step independently and hold 30 seconds Standing on One Leg: Tries to lift leg/unable to hold 3 seconds but remains standing independently Total Score: 40    Exercises given for hip strengthening B   Physical Therapy Assessment and Plan PT Assessment and Plan Clinical Impression Statement: Pt with significant hip weakness as well as balance deficits who will benefit from skilled PT to reduce risk of falling and improve quality of life. Pt will benefit from skilled therapeutic intervention in order to improve on the following deficits: Decreased balance;Decreased strength;Difficulty walking;Increased muscle spasms Rehab Potential: Good PT Frequency: Min 3X/week PT Duration: 4 weeks PT Treatment/Interventions: Gait training;Functional mobility training;Therapeutic activities;Therapeutic exercise;Neuromuscular re-education;Balance training PT Plan: begin bike, tandem gt, retrogt, sidestepping, high marching in place, SLS, functional squat, sit to stand, stand knee flex,hip ab,ext B next treatment,.  3rd treatment begin cone rotation, wall bumps,     Goals Home Exercise Program Pt will Perform Home Exercise Program: Independently PT Short Term Goals Time to Complete Short Term Goals: 2 weeks PT Short Term Goal 1: Pt to be able to stand for ten minutes PT Short Term Goal 2: Pt to be able to walk with a cane for 15 minutes at a time PT Short Term Goal 3: Pt to ambulate with a cane to prevent falls PT Long Term Goals Time to Complete Long Term Goals: 4 weeks PT Long Term Goal 1: Pt to be able to stand for 20 min at a time. PT Long Term Goal 2: Pt Berg score to improve by 10 points to allow pt to be  safe ambulating without a cane Long Term Goal 3: Pt to be ambulating for 20 minutes at a time.  Problem List Patient Active Problem List  Diagnoses  . DIABETES MELLITUS, TYPE II, CONTROLLED  . HYPERLIPIDEMIA  . ANXIETY  . HYPERTENSION  . INSUFFICIENCY, VENOUS NOS  . ALLERGIC RHINITIS, SEASONAL  . Barrett's esophagus  . HIATAL HERNIA WITH REFLUX  . IRRITABLE BOWEL SYNDROME  . RENAL DISEASE, CHRONIC, STAGE III  . DEGENERATIVE JOINT DISEASE  . OSTEOPOROSIS  . Diverticulitis  . Coronary artery disease  . Cerebrovascular disease  . Difficulty in walking  . Balance problem    PT - End of Session Equipment Utilized During Treatment: Gait belt Activity Tolerance: Patient tolerated treatment well General Behavior During Session: [facility] for tasks performed Cognition: [facility] for tasks performed PT Plan of Care PT Home Exercise Plan: given for strengthening Consulted and Agree with Plan of Care: Patient  GP  Functional Reporting Modifier  Current Status  [id number] - Mobility: Walking & Moving Around CK - At least 40% but less than 60% impaired, limited or restricted  Goal Status  [id number]  CI - At least 1% but less than 20% impaired, limited or restricted  I chose  CK due to Berg being decreased 30% but mm strength in hips being decreased over 40% B. Selicia Windom,CINDY 05/14/2012, 11:22 AM  Physician Documentation Your signature is required to indicate approval of the treatment plan as stated above.  Please sign and either send electronically or make a copy of this report for your files and return this physician signed original.   Please mark one 1.__approve of plan  2. ___approve of plan with the following conditions.   ______________________________                                                          _____________________ Physician Signature                                                                                                             Date

## 2012-05-15 ENCOUNTER — Ambulatory Visit: Payer: Medicare Other | Admitting: Gastroenterology

## 2012-05-16 ENCOUNTER — Encounter: Payer: Self-pay | Admitting: Internal Medicine

## 2012-05-22 ENCOUNTER — Ambulatory Visit (INDEPENDENT_AMBULATORY_CARE_PROVIDER_SITE_OTHER): Payer: Medicare Other | Admitting: Gastroenterology

## 2012-05-22 ENCOUNTER — Encounter: Payer: Self-pay | Admitting: Gastroenterology

## 2012-05-22 VITALS — BP 152/67 | HR 82 | Temp 97.7°F | Ht 65.0 in | Wt 166.0 lb

## 2012-05-22 DIAGNOSIS — K589 Irritable bowel syndrome without diarrhea: Secondary | ICD-10-CM

## 2012-05-22 NOTE — Patient Instructions (Addendum)
TAKE A PROBIOTIC DAILY ([employer], [employer], ALIGN).  USE BENTYL AND PHENERGAN AS NEEDED.  FOLLOW UP IN ONE YEAR. 

## 2012-05-22 NOTE — Progress Notes (Signed)
Subjective:    Patient ID: [name], female    DOB: [birth year], 78 y.o.   MRN: [id number]  PCP: [facility]  HPI Doing well. Last flare Jan 2013. NO PROBIOTIC. APPETITE GOOD. USING BENTYL PRN. NO NAUSEA OR VOMITING. FLARE IN JXB:JYNW, LOTS OR BMS, BLOATING, STARTED IN THE MIDDLE OF THE NIGHT, TOOK 3 BENTYL AND NEXT DAY GOT ALL RIGHT.  Past Medical History  Diagnosis Date  . Barrett's esophagus 2005    2008: EGD/NUR-EROSIVE ESOPHAGITIS;  . Irritable bowel syndrome   . Diverticulitis     required colostomy-1993  . Hypertension   . Coronary artery disease     CABG surgery in 5/96; non-Q MI with normal EF in 04/2003; negative stress nuclear in 02/2009  . Anxiety   . Hyperlipidemia   . Osteoporosis   . Cerebrovascular disease     Bilat. bruits; no focal disease 2000-2004; 50-69% R+ L ICA stenosis in 6/09 +plaque  . Tobacco abuse, in remission     Quit in 1992  . Chronic kidney disease (CKD), stage III (moderate)     with hypoparathyroidism    Past Surgical History  Procedure Date  . Colostomy 1993    and reversal; ruptured diverticulum  . Colonoscopy 2005  . Coronary artery bypass graft 2006    x5  . Ventral hernia repair     following incarceration with a small bowel obstruction  . Appendectomy   . Tonsillectomy   . Thyroidectomy, partial     Goiter  . Breast excisional biopsy     Benign mass  . Kidney surgery Age 24  . Esophagogastroduodenoscopy 05/13/2007    Soft stricture at the GE junction with evidence of esophageal scarring and pseudodiverticular formation./  Soft stricture at GE junction which was dilated with a balloon up to 20 mm/  Moderate-sized sliding hiatal hernia.    Allergies  Allergen Reactions  . Doxycycline Hyclate     REACTION: Nausea, epigastric pain  . Iohexol Rash    Premedication required   . Other Rash    Adhesive Tape    Current Outpatient Prescriptions  Medication Sig Dispense Refill  . acetaminophen (TYLENOL) 500 MG tablet Take 500  mg by mouth every 6 (six) hours as needed.        . ALPRAZolam (XANAX) 0.25 MG tablet Take 0.25 mg by mouth at bedtime as needed.        Marland Kitchen amLODipine (NORVASC) 5 MG tablet Take 5 mg by mouth daily.        Marland Kitchen aspirin 81 MG tablet Take 81 mg by mouth daily.        Marland Kitchen atorvastatin (LIPITOR) 80 MG tablet TAKE 1 TABLET ONCE DAILY    . benazepril (LOTENSIN) 40 MG tablet Take 40 mg by mouth daily.      . calcitRIOL (ROCALTROL) 0.25 MCG capsule Take 0.25 mcg by mouth daily.        . calcium carbonate (OS-CAL) 600 MG TABS Take 3,600 mg by mouth daily.        . cloNIDine (CATAPRES - DOSED IN MG/24 HR) 0.1 mg/24hr patch Place 1 patch onto the skin once a week.        . dicyclomine (BENTYL) 10 MG capsule Take 10 mg by mouth 4 (four) times daily -  before meals and at bedtime PRN FOR ABD PAIN  LAST TIME JAN 2013    . diltiazem (DILACOR XR) 120 MG 24 hr capsule Take 120 mg by mouth daily.        Marland Kitchen  FIBER COMPLETE PO Take 2 tablets by mouth 2 (two) times daily.        . furosemide (LASIX) 80 MG tablet Take 80 mg by mouth as needed.        . lansoprazole (PREVACID) 30 MG capsule Take 30 mg by mouth daily.        . Multiple Vitamins-Minerals (CENTRUM SILVER PO) Take 1 tablet by mouth daily.        . potassium chloride (KLOR-CON) 20 MEQ packet Take 20 mEq by mouth daily as needed.        . Prenatal Vit-Fe Psac Cmplx-FA (POLY IRON PN PO) Take 1 tablet by mouth daily.        . promethazine (PHENERGAN) 25 MG tablet Take 25 mg by mouth every 6 (six) hours as needed.    LAST TIME JAN 2013    . raloxifene (EVISTA) 60 MG tablet Take 60 mg by mouth daily.        . traMADol (ULTRAM) 50 MG tablet Take 50 mg by mouth 2 (two) times daily.           Review of Systems     Objective:   Physical Exam  Vitals reviewed. Constitutional: She is oriented to person, place, and time. She appears well-developed. No distress.  HENT:  Head: Normocephalic and atraumatic.  Mouth/Throat: Oropharynx is clear and moist. No  oropharyngeal exudate.  Eyes: Pupils are equal, round, and reactive to light. No scleral icterus.  Neck: Normal range of motion. Neck supple.  Cardiovascular: Normal rate, regular rhythm and normal heart sounds.   Pulmonary/Chest: Effort normal and breath sounds normal. No respiratory distress.  Abdominal: Soft. Bowel sounds are normal. She exhibits no distension. There is no tenderness.  Musculoskeletal: She exhibits no edema.  Neurological: She is alert and oriented to person, place, and time.       NO FOCAL DEFICITS   Psychiatric: She has a normal mood and affect.          Assessment & Plan:

## 2012-05-23 ENCOUNTER — Encounter: Payer: Self-pay | Admitting: Cardiology

## 2012-05-23 ENCOUNTER — Ambulatory Visit (INDEPENDENT_AMBULATORY_CARE_PROVIDER_SITE_OTHER): Payer: Medicare Other | Admitting: Cardiology

## 2012-05-23 VITALS — BP 130/71 | HR 72 | Ht 66.0 in | Wt 166.0 lb

## 2012-05-23 DIAGNOSIS — N183 Chronic kidney disease, stage 3 unspecified: Secondary | ICD-10-CM

## 2012-05-23 DIAGNOSIS — I7090 Unspecified atherosclerosis: Secondary | ICD-10-CM

## 2012-05-23 DIAGNOSIS — I10 Essential (primary) hypertension: Secondary | ICD-10-CM

## 2012-05-23 DIAGNOSIS — R0989 Other specified symptoms and signs involving the circulatory and respiratory systems: Secondary | ICD-10-CM

## 2012-05-23 DIAGNOSIS — I679 Cerebrovascular disease, unspecified: Secondary | ICD-10-CM

## 2012-05-23 DIAGNOSIS — E119 Type 2 diabetes mellitus without complications: Secondary | ICD-10-CM

## 2012-05-23 DIAGNOSIS — E785 Hyperlipidemia, unspecified: Secondary | ICD-10-CM

## 2012-05-23 DIAGNOSIS — I872 Venous insufficiency (chronic) (peripheral): Secondary | ICD-10-CM

## 2012-05-23 DIAGNOSIS — I2510 Atherosclerotic heart disease of native coronary artery without angina pectoris: Secondary | ICD-10-CM

## 2012-05-23 MED ORDER — CHLORTHALIDONE 25 MG PO TABS: 12.5000 mg | ORAL_TABLET | Freq: Every day | ORAL | Status: DC

## 2012-05-23 NOTE — Progress Notes (Deleted)
**Note De-Identified Charlsie Fleeger Obfuscation** Name: [name]    DOB: 25-Dec-1927  Age: 78 y.o.  MR#: [id number]       PCP:  [name], MD      Insurance: @PAYORNAME @   CC:    Chief Complaint  Patient presents with  . 1 year f/u    Pt. c/o sob with exertion and swelling in legs and feet. List+    VS BP 130/71  Pulse 72  Ht 5\' 6"  (1.676 m)  Wt 166 lb (75.297 kg)  BMI 26.79 kg/m2  Weights Current Weight  05/23/12 166 lb (75.297 kg)  05/22/12 166 lb (75.297 kg)  05/24/11 159 lb (72.122 kg)    Blood Pressure  BP Readings from Last 3 Encounters:  05/23/12 130/71  05/22/12 152/67  05/24/11 155/68     Admit date:  (Not on file) Last encounter with RMR:  01/17/2012   Allergy Allergies  Allergen Reactions  . Doxycycline Hyclate     REACTION: Nausea, epigastric pain  . Iohexol Rash    Premedication required   . Other Rash    Adhesive Tape    Current Outpatient Prescriptions  Medication Sig Dispense Refill  . acetaminophen (TYLENOL) 500 MG tablet Take 500 mg by mouth every 6 (six) hours as needed.        . ALPRAZolam (XANAX) 0.25 MG tablet Take 0.25 mg by mouth at bedtime as needed.        Marland Kitchen amLODipine (NORVASC) 5 MG tablet Take 5 mg by mouth daily.        Marland Kitchen aspirin 81 MG tablet Take 81 mg by mouth daily.        Marland Kitchen atorvastatin (LIPITOR) 80 MG tablet TAKE 1 TABLET ONCE DAILY  30 tablet  6  . benazepril (LOTENSIN) 40 MG tablet Take 40 mg by mouth daily.        . calcitRIOL (ROCALTROL) 0.25 MCG capsule Take 0.25 mcg by mouth daily.        . calcium carbonate (OS-CAL) 600 MG TABS Take 3,600 mg by mouth daily.        . cloNIDine (CATAPRES - DOSED IN MG/24 HR) 0.1 mg/24hr patch Place 1 patch onto the skin once a week.        . dicyclomine (BENTYL) 10 MG capsule Take 10 mg by mouth 4 (four) times daily -  before meals and at bedtime.        Marland Kitchen diltiazem (DILACOR XR) 120 MG 24 hr capsule Take 120 mg by mouth daily.        Marland Kitchen FIBER COMPLETE PO Take 2 tablets by mouth 2 (two) times daily.        . furosemide (LASIX) 80 MG  tablet Take 80 mg by mouth as needed.        . lansoprazole (PREVACID) 30 MG capsule Take 30 mg by mouth daily.        . Multiple Vitamins-Minerals (CENTRUM SILVER PO) Take 1 tablet by mouth daily.        . potassium chloride (KLOR-CON) 20 MEQ packet Take 20 mEq by mouth daily as needed.        . Prenatal Vit-Fe Psac Cmplx-FA (POLY IRON PN PO) Take 1 tablet by mouth daily.        . promethazine (PHENERGAN) 25 MG tablet Take 25 mg by mouth every 6 (six) hours as needed.        . raloxifene (EVISTA) 60 MG tablet Take 60 mg by mouth daily.        Marland Kitchen **Note De-Identified Maribel Hadley Obfuscation** traMADol (ULTRAM) 50 MG tablet Take 50 mg by mouth 2 (two) times daily.         Discontinued Meds:   There are no discontinued medications.  Patient Active Problem List  Diagnosis  . DIABETES MELLITUS, TYPE II, CONTROLLED  . HYPERLIPIDEMIA  . ANXIETY  . INSUFFICIENCY, VENOUS NOS  . Barrett's esophagus  . HIATAL HERNIA WITH REFLUX  . IRRITABLE BOWEL SYNDROME  . RENAL DISEASE, CHRONIC, STAGE III  . OSTEOPOROSIS  . Diverticulitis  . Arteriosclerotic cardiovascular disease (ASCVD)  . Cerebrovascular disease  . Hypertension    LABS No visits with results within 3 Month(s) from this visit. Latest known visit with results is:  Office Visit on 05/24/2011  Component Date Value  . Cholesterol 06/20/2011 195   . Triglycerides 06/20/2011 173*  . HDL 06/20/2011 53   . Total CHOL/HDL Ratio 06/20/2011 3.7   . VLDL 06/20/2011 35   . LDL Cholesterol 06/20/2011 107*     Results for this Opt Visit:     Results for orders placed in visit on 05/24/11  LIPID PANEL      Component Value Range   Cholesterol 195  0 - 200 mg/dL   Triglycerides 130 (*) <150 mg/dL   HDL 53  >86 mg/dL   Total CHOL/HDL Ratio 3.7     VLDL 35  0 - 40 mg/dL   LDL Cholesterol 578 (*) 0 - 99 mg/dL    EKG Orders placed in visit on 02/03/09  . CONVERTED CEMR EKG     Prior Assessment and Plan Problem List as of 05/23/2012            Cardiology Problems    HYPERLIPIDEMIA   Last Assessment & Plan Note   05/24/2011 Office Visit Signed 05/24/2011 12:17 PM by Jodelle Gross, NP    Review of cholesterol studies completed in May of 2012 demonstrated TC 229;TG 142; LDL 131; and HDL 70.  Will increase avorvastatin to 40 mg. Will recheck status in one month with lipids.      INSUFFICIENCY, VENOUS NOS   Arteriosclerotic cardiovascular disease (ASCVD)   Last Assessment & Plan Note   05/24/2011 Office Visit Signed 05/24/2011 12:18 PM by Jodelle Gross, NP    The patient has been seen and examined by me and by Dr. Dietrich Pates. She is doing well from a cardiac standpoint without any new symptoms.  She will continue on current medication regimen and be seen again in one year.    Cerebrovascular disease   Last Assessment & Plan Note   05/24/2011 Office Visit Signed 05/24/2011 12:23 PM by Jodelle Gross, NP    I have continued to hear carotid bruits. Last check of carotids was completed in July of 2011.  This demonstrated bilateral carotid bifurcation and proximal ICA plaque resulting in no more than 50-69% diameter stenosis.  We will repeat this study for careful follow-up of progression of disease.   She also having some mild intermittent claudication symptoms, most on the left where she sometimes feels weakness there when walking.  I have not been able to palpate DP or PT pulses in the left leg, and do hear bilateral femoral bruits.  Dopplers demonstrate audible sound.  Will do ABI's to evaluate for evidence of PAD.  More recommendations per Dr. Dietrich Pates based upon his review of results.    Hypertension     Other   DIABETES MELLITUS, TYPE II, CONTROLLED   ANXIETY   Barrett's esophagus   HIATAL HERNIA **Note De-Identified Zeth Buday Obfuscation** WITH REFLUX   Last Assessment & Plan Note   03/23/2011 Office Visit Signed 03/23/2011  2:46 PM by West Bali, MD    No evidence of obstruction. Continue Prevacid. OPV in 12 mos.    IRRITABLE BOWEL SYNDROME   Last Assessment & Plan Note   05/22/2012  Office Visit Signed 05/23/2012 11:06 AM by West Bali, MD    TAKE A PROBIOTIC DAILY ([employer], [employer], ALIGN).  USE BENTYL AND PHENERGAN AS NEEDED.  FOLLOW UP IN ONE YEAR.    RENAL DISEASE, CHRONIC, STAGE III   OSTEOPOROSIS   Diverticulitis       Imaging: No results found.   FRS Calculation: Score not calculated

## 2012-05-23 NOTE — Progress Notes (Signed)
Reminder in epic to follow up in one year with SF in E30 

## 2012-05-23 NOTE — Progress Notes (Signed)
Patient ID: [name], female   DOB: 26-Sep-1928, 78 y.o.   MRN: [id number]  HPI: Scheduled return visit for this very nice woman followed for coronary artery disease, now 18 years following CABG surgery, and management of cardiovascular risk factors.  Since she was last seen one year ago, she has done quite well.  She has developed no new medical problems, required no urgent medical care and not been hospitalized.  She has dependent edema that worsens over the course of the day and resolves by morning.  She previously took moderate doses of furosemide, but was advised by Dr. Kristian Covey to use this only as needed, and she rarely feels that she does.  Amlodipine dosage was reduced from 10 to 5 mg last year, but this has not been particularly beneficial.  Blood pressure tends to be elevated in medical settings but normal at home with a sphygmomanometer that has been tested against one of our office devices.  Exercise has been impaired due to leg fatigue and discomfort.  Recent noninvasive studies reveal trivial PVD.  Prior to Admission medications   Medication Sig Start Date End Date Taking? Authorizing Provider  acetaminophen (TYLENOL) 500 MG tablet Take 500 mg by mouth every 6 (six) hours as needed.     Yes Historical Provider, MD  ALPRAZolam Prudy Feeler) 0.25 MG tablet Take 0.25 mg by mouth at bedtime as needed.     Yes Historical Provider, MD  amLODipine (NORVASC) 5 MG tablet Take 5 mg by mouth daily.     Yes Historical Provider, MD  aspirin 81 MG tablet Take 81 mg by mouth daily.     Yes Historical Provider, MD  atorvastatin (LIPITOR) 80 MG tablet TAKE 1 TABLET ONCE DAILY 01/17/12  Yes Kathlen Brunswick, MD  benazepril (LOTENSIN) 40 MG tablet Take 40 mg by mouth daily.     Yes Historical Provider, MD  calcitRIOL (ROCALTROL) 0.25 MCG capsule Take 0.25 mcg by mouth daily.     Yes Historical Provider, MD  calcium carbonate (OS-CAL) 600 MG TABS Take 3,600 mg by mouth daily.     Yes Historical Provider, MD    cloNIDine (CATAPRES - DOSED IN MG/24 HR) 0.1 mg/24hr patch Place 1 patch onto the skin once a week.     Yes Historical Provider, MD  dicyclomine (BENTYL) 10 MG capsule Take 10 mg by mouth 4 (four) times daily -  before meals and at bedtime.     Yes Historical Provider, MD  diltiazem (DILACOR XR) 120 MG 24 hr capsule Take 120 mg by mouth daily.     Yes Historical Provider, MD  FIBER COMPLETE PO Take 2 tablets by mouth 2 (two) times daily.     Yes Historical Provider, MD  furosemide (LASIX) 80 MG tablet Take 80 mg by mouth as needed.     Yes Historical Provider, MD  lansoprazole (PREVACID) 30 MG capsule Take 30 mg by mouth daily.     Yes Historical Provider, MD  Multiple Vitamins-Minerals (CENTRUM SILVER PO) Take 1 tablet by mouth daily.     Yes Historical Provider, MD  potassium chloride (KLOR-CON) 20 MEQ packet Take 20 mEq by mouth daily as needed.     Yes Historical Provider, MD  Prenatal Vit-Fe Psac Cmplx-FA (POLY IRON PN PO) Take 1 tablet by mouth daily.     Yes Historical Provider, MD  promethazine (PHENERGAN) 25 MG tablet Take 25 mg by mouth every 6 (six) hours as needed.     Yes Historical Provider, MD  raloxifene (EVISTA) 60 MG tablet Take 60 mg by mouth daily.     Yes Historical Provider, MD  traMADol (ULTRAM) 50 MG tablet Take 50 mg by mouth 2 (two) times daily.    Yes Historical Provider, MD   Allergies  Allergen Reactions  . Doxycycline Hyclate     REACTION: Nausea, epigastric pain  . Iohexol Rash    Premedication required   . Other Rash    Adhesive Tape     Past medical history, social history, and family history reviewed and updated.  ROS: No orthopnea nor PND.  Stable class II dyspnea on exertion.  She experiences no chest discomfort, palpitations, lightheadedness or syncope.  All other systems reviewed and are negative.  PHYSICAL EXAM: BP 130/71  Pulse 72  Ht 5\' 6"  (1.676 m)  Wt 75.297 kg (166 lb)  BMI 26.79 kg/m2  General-Well developed; no acute distress Body  habitus-proportionate weight and height Neck-No JVD; bilateral carotid bruits Lungs-clear lung fields; resonant to percussion Cardiovascular-normal PMI; normal S1 and S2; grade 1-2/6 basilar systolic ejection murmur Abdomen-normal bowel sounds; soft and non-tender without masses or organomegaly Musculoskeletal-No deformities, no cyanosis or clubbing Neurologic-Normal cranial nerves; symmetric strength and tone Skin-Warm, no significant lesions Extremities-distal pulses 1+, but Doppler identifies flow signals easily that are biphasic; no edema  ASSESSMENT AND PLAN:  McLean Bing, MD 05/23/2012 11:58 AM

## 2012-05-23 NOTE — Assessment & Plan Note (Signed)
TAKE A PROBIOTIC DAILY ([employer], [employer], ALIGN).  USE BENTYL AND PHENERGAN AS NEEDED.  FOLLOW UP IN ONE YEAR.

## 2012-05-23 NOTE — Assessment & Plan Note (Signed)
Whitecoat hypertension with good blood pressure control at home.  In an attempt to ameliorate peripheral edema, amlodipine will be discontinued and low-dose thiazide diuretic therapy initiated.  Patient will monitor blood pressure at home and return in one month for a nurse visit and a repeat chemistry profile.

## 2012-05-23 NOTE — Patient Instructions (Addendum)
Your physician recommends that you schedule a follow-up appointment in:  1 - 1 year follow up 2 - Nurse visit in 1 month  Your physician has recommended you make the following change in your medication:  1 - STOP Amlodipine 2 - START Chlorthalidone 12.5 mg daily  Your physician has requested that you regularly monitor and record your blood pressure readings at home. Please use the same machine at the same time of day to check your readings and record them to bring to your follow-up visit.  Your physician recommends that you return for lab work in: On or around July 15th  Your physician has requested that you have a carotid duplex. This test is an ultrasound of the carotid arteries in your neck. It looks at blood flow through these arteries that supply the brain with blood. Allow one hour for this exam. There are no restrictions or special instructions.  Low salt diet (Informatin enclosed)

## 2012-05-23 NOTE — Assessment & Plan Note (Signed)
Lipid control is fairly good, if somewhat suboptimal.  She is already receiving high-dose statin therapy.  Addition of a second lipid-lowering agent would likely not substantially affect course of vascular disease.

## 2012-05-23 NOTE — Assessment & Plan Note (Signed)
No symptoms to suggest progression of coronary disease or occurrence of myocardial ischemia.  Our strategy will continue to be optimal control of cardiovascular risk factors.

## 2012-05-23 NOTE — Assessment & Plan Note (Signed)
Moderately severe cerebrovascular disease without neurologic symptoms.  Closer anatomic monitoring will be performed with a repeat ultrasound study in 6 months.

## 2012-05-23 NOTE — Assessment & Plan Note (Signed)
Multiple contributors to peripheral edema including treatment with to calcium channel antagonist, less than optimal salt consumption, and a decreased activity level.  Antihypertensive regimen will be modified as noted above in an attempt to improve her edema.

## 2012-05-23 NOTE — Assessment & Plan Note (Signed)
Good control of diabetes on recent assessment of hemoglobin A1c, which was 6.5.

## 2012-05-23 NOTE — Progress Notes (Signed)
Faxed to PCP

## 2012-05-27 ENCOUNTER — Ambulatory Visit (HOSPITAL_COMMUNITY): Payer: Medicare Other | Admitting: *Deleted

## 2012-05-29 ENCOUNTER — Ambulatory Visit (HOSPITAL_COMMUNITY)
Admission: RE | Admit: 2012-05-29 | Discharge: 2012-05-29 | Disposition: A | Discharge status: Home / Self Care | Payer: Medicare Other | Source: Ambulatory Visit | Attending: Internal Medicine | Admitting: Internal Medicine

## 2012-05-29 NOTE — Progress Notes (Signed)
Physical Therapy Treatment Patient Details  Name: [name] MRN: [id number] Date of Birth: 1928-05-11  Today's Date: 05/29/2012 Time: 2440-1027 PT Time Calculation (min): 50 min  Visit#: 2  of 12   Re-eval: 06/28/12  Charge: therex 25 NMR 23  Authorization: medicare  Authorization Time Period: mobility current: CK, goal: CI  Authorization Visit#: 2  of 10    Subjective: Symptoms/Limitations Symptoms: Pt. states she only has soreness today in her hips and knees, no pain reported upon entering clinic but as began standing exericses, c/o hip pain, especially on the Left. Pain Assessment Currently in Pain?: No/denies  Objective:   Exercise/Treatments Aerobic Stationary Bike: 6'@ 2.0, seat 7 Standing Heel Raises: 10 reps;Limitations Heel Raises Limitations: toe raises 10 reps Knee Flexion: 10 reps Functional Squat: 10 reps;Limitations Functional Squat Limitations: 7 reps c/o L knee pain SLS: 3 sets 20" max with 1 finger HHA  BLE  Other Standing Knee Exercises: Hip abd/ext 10 reps each; 5 STS w/o HHA min assistance Other Standing Knee Exercises: high marching 10 reps; tandem stance 2x 30", tandem and retro gait 1 RT     Physical Therapy Assessment and Plan PT Assessment and Plan Clinical Impression Statement: Began balance activities per PT POC. Pt very friendly and easy to work with but unmotivated with activities stating she does not understand why the MD recommended her coming to PT at her age. Pt was explained the benefits of therapy to increase strength and improve balance to reduce risk of falls and increase her overall QOL but her coming to OPPT is her choice. Pt able to complete all activities following vc-ing and demonstration with significant hip weakness noted and required min-mod assistance with balance activiteis. Pt limitied by fatigue, required several rest breaks through whole session. PT Plan: Continue with current POC, begin sidestepping, cone rotation and  wall bumps next session.    Goals    Problem List Patient Active Problem List  Diagnosis  . DIABETES MELLITUS, TYPE II, CONTROLLED  . HYPERLIPIDEMIA  . ANXIETY  . INSUFFICIENCY, VENOUS NOS  . Barrett's esophagus  . HIATAL HERNIA WITH REFLUX  . IRRITABLE BOWEL SYNDROME  . RENAL DISEASE, CHRONIC, STAGE III  . OSTEOPOROSIS  . Diverticulitis  . Arteriosclerotic cardiovascular disease (ASCVD)  . Cerebrovascular disease  . Hypertension    PT - End of Session Equipment Utilized During Treatment: Gait belt Activity Tolerance: Patient tolerated treatment well;Patient limited by fatigue General Behavior During Session: [facility] for tasks performed Cognition: [facility] for tasks performed  GP No functional reporting required  Juel Burrow, PTA 05/29/2012, 12:27 PM

## 2012-05-31 ENCOUNTER — Ambulatory Visit (HOSPITAL_COMMUNITY): Payer: Medicare Other | Admitting: Physical Therapy

## 2012-06-03 ENCOUNTER — Ambulatory Visit (HOSPITAL_COMMUNITY): Payer: Medicare Other | Admitting: *Deleted

## 2012-06-05 ENCOUNTER — Ambulatory Visit (HOSPITAL_COMMUNITY): Payer: Medicare Other | Admitting: *Deleted

## 2012-06-07 ENCOUNTER — Ambulatory Visit (HOSPITAL_COMMUNITY): Payer: Medicare Other | Admitting: Physical Therapy

## 2012-06-10 ENCOUNTER — Ambulatory Visit (HOSPITAL_COMMUNITY): Payer: Medicare Other | Admitting: Physical Therapy

## 2012-06-12 ENCOUNTER — Ambulatory Visit (HOSPITAL_COMMUNITY): Payer: Medicare Other | Admitting: Physical Therapy

## 2012-06-12 ENCOUNTER — Other Ambulatory Visit: Payer: Self-pay | Admitting: *Deleted

## 2012-06-12 DIAGNOSIS — I10 Essential (primary) hypertension: Secondary | ICD-10-CM

## 2012-06-14 ENCOUNTER — Ambulatory Visit (HOSPITAL_COMMUNITY): Payer: Medicare Other | Admitting: Physical Therapy

## 2012-06-17 ENCOUNTER — Ambulatory Visit (HOSPITAL_COMMUNITY): Payer: Medicare Other | Admitting: *Deleted

## 2012-06-19 ENCOUNTER — Ambulatory Visit (HOSPITAL_COMMUNITY): Payer: Medicare Other | Admitting: *Deleted

## 2012-06-21 ENCOUNTER — Ambulatory Visit (HOSPITAL_COMMUNITY): Payer: Medicare Other | Admitting: Physical Therapy

## 2012-06-21 ENCOUNTER — Ambulatory Visit (INDEPENDENT_AMBULATORY_CARE_PROVIDER_SITE_OTHER): Payer: Medicare Other

## 2012-06-21 ENCOUNTER — Encounter: Payer: Self-pay | Admitting: Cardiology

## 2012-06-21 VITALS — BP 164/67 | HR 67 | Ht 66.0 in | Wt 164.0 lb

## 2012-06-21 DIAGNOSIS — I10 Essential (primary) hypertension: Secondary | ICD-10-CM

## 2012-06-21 NOTE — Progress Notes (Signed)
Pt. arrives in office for a 1 month BP check. On last OV with Rothbart on 6-13 pt. was advised to stop taking Amlodipine and start taking Chlorthalidone 12.5 mg qd. Due to edema. Pt. Has no complaints at this time and edema has improved with no swelling. Her BP this morning is 164/67 and at last OV her BP was 130/71. She did bring her BP diary to this visit and her home BP's are lower than today's (BP diary scanned into pt's chart  And a copy pinned to board at Dr. Marvel Plan desk in nursing station). Pt. Is advised to continue current medical treatment and that we will contact her with Dr. Marvel Plan recommendation./LV

## 2012-06-23 ENCOUNTER — Encounter: Payer: Self-pay | Admitting: Cardiology

## 2012-06-27 ENCOUNTER — Encounter: Payer: Self-pay | Admitting: *Deleted

## 2012-06-27 NOTE — Progress Notes (Signed)
Patient ID: [name], female   DOB: 05/09/1928, 78 y.o.   MRN: [id number]  Blood pressure appears to be excellent at home but elevated in the office suggesting white coat hypertension.  We will plan to provide her with ambulatory blood pressure monitor for 24 hours.

## 2012-06-28 ENCOUNTER — Other Ambulatory Visit: Payer: Self-pay | Admitting: Cardiology

## 2012-06-28 ENCOUNTER — Encounter: Payer: Self-pay | Admitting: Cardiology

## 2012-06-28 DIAGNOSIS — D649 Anemia, unspecified: Secondary | ICD-10-CM | POA: Insufficient documentation

## 2012-06-28 LAB — BASIC METABOLIC PANEL
BUN: 20 mg/dL (ref 6–23)
Potassium: 5.2 mEq/L (ref 3.5–5.3)

## 2012-06-28 LAB — CBC
HCT: 32.8 % — ABNORMAL LOW (ref 36.0–46.0)
MCHC: 32.9 g/dL (ref 30.0–36.0)
Platelets: 285 10*3/uL (ref 150–400)
RDW: 14.1 % (ref 11.5–15.5)

## 2012-07-15 ENCOUNTER — Other Ambulatory Visit: Payer: Self-pay | Admitting: *Deleted

## 2012-07-15 DIAGNOSIS — I10 Essential (primary) hypertension: Secondary | ICD-10-CM

## 2012-07-29 ENCOUNTER — Telehealth: Payer: Self-pay | Admitting: Cardiology

## 2012-07-29 NOTE — Telephone Encounter (Signed)
Explained rationale to patient and will schedule when it is convenient for her

## 2012-07-29 NOTE — Telephone Encounter (Signed)
Called patient to give appointment for 24 hr BP monitor.  Patient states that she did not know anything about needing this monitor and would like to speak to nurse to clarify. / tg

## 2012-08-08 ENCOUNTER — Ambulatory Visit (HOSPITAL_COMMUNITY)
Admission: RE | Admit: 2012-08-08 | Discharge: 2012-08-08 | Disposition: A | Discharge status: Home / Self Care | Payer: Medicare Other | Source: Ambulatory Visit | Attending: Cardiology | Admitting: Cardiology

## 2012-08-08 DIAGNOSIS — I10 Essential (primary) hypertension: Secondary | ICD-10-CM

## 2012-08-08 DIAGNOSIS — R030 Elevated blood-pressure reading, without diagnosis of hypertension: Secondary | ICD-10-CM | POA: Insufficient documentation

## 2012-08-08 NOTE — Progress Notes (Signed)
Ambulatory Blood Pressure Monitor in progress 24hrs. 

## 2012-08-14 ENCOUNTER — Encounter: Payer: Self-pay | Admitting: Cardiology

## 2012-08-16 ENCOUNTER — Other Ambulatory Visit: Payer: Self-pay | Admitting: *Deleted

## 2012-08-16 ENCOUNTER — Encounter: Payer: Self-pay | Admitting: *Deleted

## 2012-08-16 ENCOUNTER — Telehealth: Payer: Self-pay | Admitting: *Deleted

## 2012-08-16 DIAGNOSIS — I10 Essential (primary) hypertension: Secondary | ICD-10-CM

## 2012-08-16 MED ORDER — CHLORTHALIDONE 25 MG PO TABS: 25.0000 mg | ORAL_TABLET | Freq: Every day | ORAL | Status: DC

## 2012-08-16 MED ORDER — CLONIDINE HCL 0.3 MG/24HR TD PTWK: 1.0000 | MEDICATED_PATCH | TRANSDERMAL | Status: DC

## 2012-08-16 NOTE — Telephone Encounter (Signed)
Attempted to contact patient with the following instructions from Dr Dietrich Pates, related to her 24 hour blood pressure readings:  1 - INCREASE Chlorthalidone to 25 mg daily 2 - INCREASE Clonidine patch to level 3 weekly 3 - Home blood pressures and return list in 1 month 4 - BMET in 1 month  Message left to return call

## 2012-08-19 ENCOUNTER — Other Ambulatory Visit: Payer: Self-pay | Admitting: Cardiology

## 2012-08-22 NOTE — Telephone Encounter (Signed)
Patient was made aware of recommendations last week and has followed instructions.

## 2012-09-09 ENCOUNTER — Telehealth: Payer: Self-pay | Admitting: Cardiology

## 2012-09-09 NOTE — Telephone Encounter (Signed)
Patient has questions regarding letter that she received about being labwork being due. / tg

## 2012-09-09 NOTE — Telephone Encounter (Signed)
Chlorthalidone was increased on 9/9 from 12.5 mg to 25 mg, however, pt did not start until 9/24, therefore will obtain lab Oct 24th and have a nurse visit that same day.  Verbalized understanding.

## 2012-09-27 ENCOUNTER — Other Ambulatory Visit: Payer: Self-pay | Admitting: *Deleted

## 2012-09-27 DIAGNOSIS — I10 Essential (primary) hypertension: Secondary | ICD-10-CM

## 2012-10-03 ENCOUNTER — Ambulatory Visit (INDEPENDENT_AMBULATORY_CARE_PROVIDER_SITE_OTHER): Payer: Medicare Other | Admitting: *Deleted

## 2012-10-03 VITALS — BP 152/60 | HR 64 | Ht 66.0 in | Wt 167.0 lb

## 2012-10-03 DIAGNOSIS — I10 Essential (primary) hypertension: Secondary | ICD-10-CM

## 2012-10-03 NOTE — Progress Notes (Signed)
Presents for blood pressure check, as a result of 24 hour blood pressure results on 9/6.  Changes were made to her medication regimen, at that time, to include; Increase chlorthalidone to 25 mg daily and increase clonidine patch to TTS 3.   States taking medications, as prescribed.  Presents a list of relitively adequate blood pressures from home.  No complaints noted.  Sent for BMET today, as ordered.

## 2012-10-04 ENCOUNTER — Encounter: Payer: Self-pay | Admitting: Cardiology

## 2012-10-04 LAB — BASIC METABOLIC PANEL
Chloride: 98 mEq/L (ref 96–112)
Glucose, Bld: 102 mg/dL — ABNORMAL HIGH (ref 70–99)
Potassium: 4.6 mEq/L (ref 3.5–5.3)
Sodium: 136 mEq/L (ref 135–145)

## 2012-10-07 ENCOUNTER — Encounter: Payer: Self-pay | Admitting: Cardiology

## 2012-10-14 NOTE — Progress Notes (Signed)
Patient ID: [name], female   DOB: 09-24-1928, 78 y.o.   MRN: [id number]  Increase diltiazem to 180 mg per day with next Rx.

## 2012-10-17 MED ORDER — DILTIAZEM HCL ER COATED BEADS 180 MG PO CP24: 180.0000 mg | ORAL_CAPSULE | Freq: Every day | ORAL | Status: DC

## 2012-10-17 NOTE — Patient Instructions (Signed)
Advised patient of recommendations. Verbalized understanding

## 2012-10-21 ENCOUNTER — Other Ambulatory Visit: Payer: Self-pay | Admitting: Cardiology

## 2012-12-03 ENCOUNTER — Telehealth: Payer: Self-pay

## 2012-12-03 MED ORDER — DICYCLOMINE HCL 10 MG PO CAPS: ORAL_CAPSULE | ORAL | Status: DC

## 2012-12-03 NOTE — Telephone Encounter (Signed)
PLEASE CALL PT.  SHE SHOULD DRINK FLUIDS TO KEEP HER URINE LIGHT YELLOW. USE BENTYL 1-2 30 MINUTES PRIOR TO MEALS AND AT BEDTIME. NO MORE THAN 8 PER DAY. IT MAY CAUSE DROWISNESS, DRY MOUTH/DRY EYES, DIFFICULTY URINATING, AND CONSTIPATION.  HER RX HAS BEEN SEN TO [facility].

## 2012-12-03 NOTE — Telephone Encounter (Signed)
Called and informed pt.  

## 2012-12-03 NOTE — Telephone Encounter (Signed)
Pt called and said she thinks she needs some more Dicyclomine. She finished up her bottle dated 2011. Had not had to take often. But the last few days she has had a lot of diarrhea, she can't remember the number of times, but said it is worse than it has been in a while. She is drinking fluids, but is weak. She uses [facility] in [place], please advise!

## 2012-12-06 ENCOUNTER — Encounter: Payer: Self-pay | Admitting: Cardiology

## 2013-01-07 ENCOUNTER — Ambulatory Visit (HOSPITAL_COMMUNITY)
Admission: RE | Admit: 2013-01-07 | Discharge: 2013-01-07 | Disposition: A | Discharge status: Home / Self Care | Payer: Medicare Other | Source: Ambulatory Visit | Attending: Cardiology | Admitting: Cardiology

## 2013-01-07 DIAGNOSIS — E119 Type 2 diabetes mellitus without complications: Secondary | ICD-10-CM | POA: Insufficient documentation

## 2013-01-07 DIAGNOSIS — I658 Occlusion and stenosis of other precerebral arteries: Secondary | ICD-10-CM | POA: Insufficient documentation

## 2013-01-07 DIAGNOSIS — R0989 Other specified symptoms and signs involving the circulatory and respiratory systems: Secondary | ICD-10-CM | POA: Insufficient documentation

## 2013-01-07 DIAGNOSIS — F17200 Nicotine dependence, unspecified, uncomplicated: Secondary | ICD-10-CM | POA: Insufficient documentation

## 2013-01-07 DIAGNOSIS — I10 Essential (primary) hypertension: Secondary | ICD-10-CM | POA: Insufficient documentation

## 2013-01-08 ENCOUNTER — Encounter: Payer: Self-pay | Admitting: Cardiology

## 2013-01-09 ENCOUNTER — Other Ambulatory Visit: Payer: Self-pay | Admitting: *Deleted

## 2013-01-09 DIAGNOSIS — I6529 Occlusion and stenosis of unspecified carotid artery: Secondary | ICD-10-CM

## 2013-05-15 ENCOUNTER — Other Ambulatory Visit: Payer: Self-pay | Admitting: Cardiology

## 2013-05-27 ENCOUNTER — Encounter: Payer: Self-pay | Admitting: Gastroenterology

## 2013-05-27 ENCOUNTER — Ambulatory Visit: Payer: Medicare Other | Admitting: Cardiology

## 2013-05-29 ENCOUNTER — Encounter: Payer: Self-pay | Admitting: Cardiology

## 2013-05-29 ENCOUNTER — Ambulatory Visit (INDEPENDENT_AMBULATORY_CARE_PROVIDER_SITE_OTHER): Payer: Medicare Other | Admitting: Cardiology

## 2013-05-29 VITALS — BP 186/73 | HR 70 | Ht 65.0 in | Wt 156.0 lb

## 2013-05-29 DIAGNOSIS — K219 Gastro-esophageal reflux disease without esophagitis: Secondary | ICD-10-CM

## 2013-05-29 DIAGNOSIS — I7090 Unspecified atherosclerosis: Secondary | ICD-10-CM

## 2013-05-29 DIAGNOSIS — D649 Anemia, unspecified: Secondary | ICD-10-CM

## 2013-05-29 DIAGNOSIS — M810 Age-related osteoporosis without current pathological fracture: Secondary | ICD-10-CM

## 2013-05-29 DIAGNOSIS — I10 Essential (primary) hypertension: Secondary | ICD-10-CM

## 2013-05-29 DIAGNOSIS — I2510 Atherosclerotic heart disease of native coronary artery without angina pectoris: Secondary | ICD-10-CM

## 2013-05-29 DIAGNOSIS — I359 Nonrheumatic aortic valve disorder, unspecified: Secondary | ICD-10-CM

## 2013-05-29 DIAGNOSIS — I679 Cerebrovascular disease, unspecified: Secondary | ICD-10-CM

## 2013-05-29 DIAGNOSIS — I350 Nonrheumatic aortic (valve) stenosis: Secondary | ICD-10-CM

## 2013-05-29 DIAGNOSIS — E785 Hyperlipidemia, unspecified: Secondary | ICD-10-CM

## 2013-05-29 NOTE — Assessment & Plan Note (Addendum)
Management is difficult as the result of a component of whitecoat hypertension. Serial 24 hour ambulatory monitoring could be useful in more precisely adjusting therapy.  When last assessed in 09/2012, nearly all determinations provided by patient were normal. At this point, current medications will be continued. If very high values persist on in-office measurements, further use of home or 24 hour ambulatory monitoring will be necessary.

## 2013-05-29 NOTE — Assessment & Plan Note (Signed)
Most recent carotid ultrasound study 5 months ago revealed moderate plaque and moderate bilateral ICA stenosis. A followup examination will be performed in 7 months.

## 2013-05-29 NOTE — Patient Instructions (Addendum)
Your physician recommends that you schedule a follow-up appointment in: ONE YEAR  Your physician has requested that you have a carotid duplex. This test is an ultrasound of the carotid arteries in your neck. It looks at blood flow through these arteries that supply the brain with blood. Allow one hour for this exam. There are no restrictions or special instructions.6 MONTHS, WE WILL CALL YOU TO ADVISE APT DATE AND TIME

## 2013-05-29 NOTE — Assessment & Plan Note (Signed)
[name] has done extremely well since his CABG surgery nearly 20 years ago. She continues to report no symptoms to suggest progression of disease or active myocardial ischemia. Optimal control of cardiovascular risk factors will remain in our major goal in treatment.

## 2013-05-29 NOTE — Progress Notes (Signed)
Patient ID: [name], female   DOB: Jun 01, 1928, 79 y.o.   MRN: [id number]  HPI: Schedule return visit for this nice woman with coronary artery disease and multiple cardiovascular risk factors. She is asymptomatic from a cardiac standpoint. She continues to monitor blood pressures at home, which tend to be normal.  No recent laboratory testing available, but records currently being sought.  Current Outpatient Prescriptions  Medication Sig Dispense Refill  . acetaminophen (TYLENOL) 500 MG tablet Take 500 mg by mouth every 6 (six) hours as needed.        . ALPRAZolam (XANAX) 0.25 MG tablet Take 0.5 mg by mouth daily.       Marland Kitchen aspirin 81 MG tablet Take 81 mg by mouth daily.        Marland Kitchen atorvastatin (LIPITOR) 80 MG tablet TAKE ONE TABLET ONCE DAILY  30 tablet  11  . benazepril (LOTENSIN) 40 MG tablet Take 40 mg by mouth daily.        . calcium carbonate (OS-CAL) 600 MG TABS Take 3,600 mg by mouth daily.        . chlorthalidone (HYGROTON) 25 MG tablet Take 1 tablet (25 mg total) by mouth daily.  30 tablet  11  . cloNIDine (CATAPRES - DOSED IN MG/24 HR) 0.3 mg/24hr Place 1 patch (0.3 mg total) onto the skin once a week.  4 patch  11  . dicyclomine (BENTYL) 10 MG capsule 1-2 PO 30  MINUTES PRIOR TO MEALS AND AT BEDTIME TO TREAT DIARRHEA AND ABD PAIN  60 capsule  1  . diltiazem (CARDIZEM CD) 180 MG 24 hr capsule Take 1 capsule (180 mg total) by mouth daily.  90 capsule  3  . FIBER COMPLETE PO Take 2 tablets by mouth 2 (two) times daily.        . lansoprazole (PREVACID) 30 MG capsule Take 30 mg by mouth daily.        . Multiple Vitamins-Minerals (CENTRUM SILVER PO) Take 1 tablet by mouth daily.        . potassium chloride (KLOR-CON) 20 MEQ packet Take 20 mEq by mouth daily as needed.        . Prenatal Vit-Fe Psac Cmplx-FA (POLY IRON PN PO) Take 1 tablet by mouth daily.        . raloxifene (EVISTA) 60 MG tablet Take 60 mg by mouth daily.        . traMADol (ULTRAM) 50 MG tablet Take 50 mg by mouth 2 (two)  times daily.        No current facility-administered medications for this visit.   Allergies  Allergen Reactions  . Doxycycline Hyclate     REACTION: Nausea, epigastric pain  . Iohexol Rash    Premedication required   . Other Rash    Adhesive Tape     Past medical history, social history, and family history reviewed and updated.  ROS: Denies chest pain, dyspnea, palpitations, lightheadedness or syncope. All other systems reviewed and are negative.  PHYSICAL EXAM: BP 186/73  Pulse 70  Ht 5\' 5"  (1.651 m)  Wt 70.761 kg (156 lb)  BMI 25.96 kg/m2;  Body mass index is 25.96 kg/(m^2). General-Well developed; no acute distress Body habitus-proportionate weight and height Neck-No JVD; bilateral carotid bruits Lungs-clear lung fields; resonant to percussion Cardiovascular-normal PMI; normal S1 and S2; S4 present; grade 2/6 holosystolic murmur at the left sternal border and apex Abdomen-normal bowel sounds; soft and non-tender without masses or organomegaly Musculoskeletal-No deformities, no cyanosis or clubbing  Neurologic-Normal cranial nerves; symmetric strength and tone Skin-Warm, no significant lesions Extremities-distal pulses intact; no edema  EKG: Normal sinus rhythm; delayed R-wave progression; otherwise normal. No previous tracing for comparison.  [place] Bing, MD 05/29/2013  8:47 PM  ASSESSMENT AND PLAN

## 2013-05-29 NOTE — Assessment & Plan Note (Signed)
Most recent assessment of renal function available to me was obtained 8 months ago. More recent laboratory testing being sought from patient's PCP and nephrologist.

## 2013-05-29 NOTE — Assessment & Plan Note (Addendum)
Persistent anemia of uncertain etiology. Possibly related to renal dysfunction, but appears out of proportion to slightly elevated creatinine. No recent CBC. No iron studies or other testing for anemia.  Records from Drs. Margo Aye and [place] have been requested.

## 2013-05-29 NOTE — Assessment & Plan Note (Signed)
Adequate control of hyperlipidemia on maximal dose of atorvastatin as assessed by a lipid profile obtained 6 months ago.

## 2013-05-29 NOTE — Progress Notes (Deleted)
Name: ZOA DOWTY    DOB: [birth year]  Age: 79 y.o.  MR#: [id number]       PCP:  [name], MD      Insurance: Payor: MEDICARE / Plan: MEDICARE PART A AND B / Product Type: *No Product type* /   CC:   No chief complaint on file.  Pt noted she took bp at noon at home and was 133/46 via wrist cuff, pt brought list VS Filed Vitals:   05/29/13 1426  BP: 186/73  Pulse: 70  Height: 5\' 5"  (1.651 m)  Weight: 156 lb (70.761 kg)    Weights Current Weight  05/29/13 156 lb (70.761 kg)  10/03/12 167 lb (75.751 kg)  06/21/12 164 lb (74.39 kg)    Blood Pressure  BP Readings from Last 3 Encounters:  05/29/13 186/73  10/03/12 152/60  06/21/12 164/67     Admit date:  (Not on file) Last encounter with RMR:  05/27/2013   Allergy Doxycycline hyclate; Iohexol; and Other  Current Outpatient Prescriptions  Medication Sig Dispense Refill  . acetaminophen (TYLENOL) 500 MG tablet Take 500 mg by mouth every 6 (six) hours as needed.        . ALPRAZolam (XANAX) 0.25 MG tablet Take 0.5 mg by mouth daily.       Marland Kitchen aspirin 81 MG tablet Take 81 mg by mouth daily.        Marland Kitchen atorvastatin (LIPITOR) 80 MG tablet TAKE ONE TABLET ONCE DAILY  30 tablet  11  . benazepril (LOTENSIN) 40 MG tablet Take 40 mg by mouth daily.        . calcium carbonate (OS-CAL) 600 MG TABS Take 3,600 mg by mouth daily.        . chlorthalidone (HYGROTON) 25 MG tablet Take 1 tablet (25 mg total) by mouth daily.  30 tablet  11  . cloNIDine (CATAPRES - DOSED IN MG/24 HR) 0.3 mg/24hr Place 1 patch (0.3 mg total) onto the skin once a week.  4 patch  11  . dicyclomine (BENTYL) 10 MG capsule 1-2 PO 30  MINUTES PRIOR TO MEALS AND AT BEDTIME TO TREAT DIARRHEA AND ABD PAIN  60 capsule  1  . diltiazem (CARDIZEM CD) 180 MG 24 hr capsule Take 1 capsule (180 mg total) by mouth daily.  90 capsule  3  . FIBER COMPLETE PO Take 2 tablets by mouth 2 (two) times daily.        . lansoprazole (PREVACID) 30 MG capsule Take 30 mg by mouth daily.        .  Multiple Vitamins-Minerals (CENTRUM SILVER PO) Take 1 tablet by mouth daily.        . potassium chloride (KLOR-CON) 20 MEQ packet Take 20 mEq by mouth daily as needed.        . Prenatal Vit-Fe Psac Cmplx-FA (POLY IRON PN PO) Take 1 tablet by mouth daily.        . raloxifene (EVISTA) 60 MG tablet Take 60 mg by mouth daily.        . traMADol (ULTRAM) 50 MG tablet Take 50 mg by mouth 2 (two) times daily.        No current facility-administered medications for this visit.    Discontinued Meds:    Medications Discontinued During This Encounter  Medication Reason  . calcitRIOL (ROCALTROL) 0.25 MCG capsule Error  . promethazine (PHENERGAN) 25 MG tablet Error  . furosemide (LASIX) 80 MG tablet Error    Patient Active Problem List  Diagnosis Date Noted  . Anemia 06/28/2012  . Hypertension   . Diverticulitis   . Arteriosclerotic cardiovascular disease (ASCVD)   . Cerebrovascular disease   . RENAL DISEASE, CHRONIC, STAGE III 03/11/2008  . IRRITABLE BOWEL SYNDROME 06/20/2007  . Barrett's esophagus 04/17/2007  . DIABETES MELLITUS, TYPE II, CONTROLLED 01/17/2007  . INSUFFICIENCY, VENOUS NOS 01/17/2007  . HIATAL HERNIA WITH REFLUX 01/17/2007  . Hyperlipidemia 11/09/2006  . ANXIETY 11/09/2006  . OSTEOPOROSIS 11/09/2006    LABS    Component Value Date/Time   NA 136 10/03/2012 1028   NA 135 06/27/2012 0000   NA 136 10/06/2010   K 4.6 10/03/2012 1028   K 5.2 06/27/2012 0000   K 4.3 10/06/2010   CL 98 10/03/2012 1028   CL 100 06/27/2012 0000   CL 98 10/06/2010   CO2 26 10/03/2012 1028   CO2 26 06/27/2012 0000   CO2 24 10/06/2010   GLUCOSE 102* 10/03/2012 1028   GLUCOSE 89 06/27/2012 0000   GLUCOSE 118 10/06/2010   BUN 22 10/03/2012 1028   BUN 20 06/27/2012 0000   BUN 18 10/06/2010   CREATININE 1.28* 10/03/2012 1028   CREATININE 1.31* 06/27/2012 0000   CREATININE 1.19 10/06/2010   CREATININE 1.10 02/02/2010   CREATININE 1.24* 11/04/2008 2243   CALCIUM 8.2* 10/03/2012 1028   CALCIUM  8.6 06/27/2012 0000   CALCIUM 9.3 02/02/2010   GFRNONAA 44 10/06/2010   GFRNONAA 48 02/02/2010   GFRNONAA 49* 06/17/2007 0645   GFRAA  Value: 59        The eGFR has been calculated using the MDRD equation. This calculation has not been validated in all clinical* 06/17/2007 0645   CMP     Component Value Date/Time   NA 136 10/03/2012 1028   K 4.6 10/03/2012 1028   CL 98 10/03/2012 1028   CO2 26 10/03/2012 1028   GLUCOSE 102* 10/03/2012 1028   BUN 22 10/03/2012 1028   CREATININE 1.28* 10/03/2012 1028   CREATININE 1.19 10/06/2010   CALCIUM 8.2* 10/03/2012 1028   PROT 7.2 02/02/2010   ALBUMIN 4.3 02/02/2010   AST 17 02/02/2010   ALT 14 02/02/2010   ALKPHOS 44 02/02/2010   BILITOT 0.4 11/04/2008 2243   GFRNONAA 44 10/06/2010   GFRAA  Value: 59        The eGFR has been calculated using the MDRD equation. This calculation has not been validated in all clinical* 06/17/2007 0645       Component Value Date/Time   WBC 8.3 06/27/2012 0000   WBC 6.5 10/06/2010   WBC 6.4 11/04/2008 2243   HGB 10.8* 06/27/2012 0000   HGB 11.6 10/06/2010   HGB 12.0 11/04/2008 2243   HCT 32.8* 06/27/2012 0000   HCT 35.1 10/06/2010   HCT 36.4 11/04/2008 2243   MCV 89.9 06/27/2012 0000   MCV 89.1 10/06/2010   MCV 89.0 11/04/2008 2243    Lipid Panel     Component Value Date/Time   CHOL 195 06/20/2011 0805   TRIG 173* 06/20/2011 0805   HDL 53 06/20/2011 0805   CHOLHDL 3.7 06/20/2011 0805   VLDL 35 06/20/2011 0805   LDLCALC 107* 06/20/2011 0805    ABG No results found for this basename: phart, pco2, pco2art, po2, po2art, hco3, tco2, acidbasedef, o2sat     Lab Results  Component Value Date   TSH 1.869 10/06/2010   BNP (last 3 results) No results found for this basename: PROBNP,  in the last 8760 hours Cardiac Panel (last 3  results) No results found for this basename: CKTOTAL, CKMB, TROPONINI, RELINDX,  in the last 72 hours  Iron/TIBC/Ferritin No results found for this basename: iron, tibc, ferritin      EKG Orders placed in visit on 05/29/13  . EKG 12-LEAD     Prior Assessment and Plan Problem List as of 05/29/2013   DIABETES MELLITUS, TYPE II, CONTROLLED   Last Assessment & Plan   05/23/2012 Office Visit Written 05/23/2012 12:11 PM by Kathlen Brunswick, MD     Good control of diabetes on recent assessment of hemoglobin A1c, which was 6.5.    Hyperlipidemia   Last Assessment & Plan   05/23/2012 Office Visit Written 05/23/2012 12:09 PM by Kathlen Brunswick, MD     Lipid control is fairly good, if somewhat suboptimal.  She is already receiving high-dose statin therapy.  Addition of a second lipid-lowering agent would likely not substantially affect course of vascular disease.    ANXIETY   INSUFFICIENCY, VENOUS NOS   Last Assessment & Plan   05/23/2012 Office Visit Written 05/23/2012 12:13 PM by Kathlen Brunswick, MD     Multiple contributors to peripheral edema including treatment with to calcium channel antagonist, less than optimal salt consumption, and a decreased activity level.  Antihypertensive regimen will be modified as noted above in an attempt to improve her edema.    Barrett's esophagus   HIATAL HERNIA WITH REFLUX   Last Assessment & Plan   03/23/2011 Office Visit Written 03/23/2011  2:46 PM by West Bali, MD     No evidence of obstruction. Continue Prevacid. OPV in 12 mos.    IRRITABLE BOWEL SYNDROME   Last Assessment & Plan   05/22/2012 Office Visit Written 05/23/2012 11:06 AM by West Bali, MD     TAKE A PROBIOTIC DAILY ([employer], [facility], ALIGN).  USE BENTYL AND PHENERGAN AS NEEDED.  FOLLOW UP IN ONE YEAR.    RENAL DISEASE, CHRONIC, STAGE III   OSTEOPOROSIS   Diverticulitis   Arteriosclerotic cardiovascular disease (ASCVD)   Last Assessment & Plan   05/23/2012 Office Visit Written 05/23/2012 12:06 PM by Kathlen Brunswick, MD     No symptoms to suggest progression of coronary disease or occurrence of myocardial ischemia.  Our strategy  will continue to be optimal control of cardiovascular risk factors.    Cerebrovascular disease   Last Assessment & Plan   05/23/2012 Office Visit Written 05/23/2012 12:07 PM by Kathlen Brunswick, MD     Moderately severe cerebrovascular disease without neurologic symptoms.  Closer anatomic monitoring will be performed with a repeat ultrasound study in 6 months.    Hypertension   Last Assessment & Plan   05/23/2012 Office Visit Written 05/23/2012 12:10 PM by Kathlen Brunswick, MD     Whitecoat hypertension with good blood pressure control at home.  In an attempt to ameliorate peripheral edema, amlodipine will be discontinued and low-dose thiazide diuretic therapy initiated.  Patient will monitor blood pressure at home and return in one month for a nurse visit and a repeat chemistry profile.    Anemia       Imaging: No results found.

## 2013-06-03 ENCOUNTER — Encounter: Payer: Self-pay | Admitting: Cardiology

## 2013-06-18 ENCOUNTER — Ambulatory Visit (INDEPENDENT_AMBULATORY_CARE_PROVIDER_SITE_OTHER): Payer: Medicare Other | Admitting: Gastroenterology

## 2013-06-18 ENCOUNTER — Encounter: Payer: Self-pay | Admitting: Gastroenterology

## 2013-06-18 VITALS — BP 112/50 | HR 57 | Temp 97.4°F | Ht 65.0 in | Wt 165.2 lb

## 2013-06-18 DIAGNOSIS — K2270 Barrett's esophagus without dysplasia: Secondary | ICD-10-CM

## 2013-06-18 DIAGNOSIS — K589 Irritable bowel syndrome without diarrhea: Secondary | ICD-10-CM

## 2013-06-18 NOTE — Progress Notes (Signed)
Subjective:    Patient ID: [name], female    DOB: 05-02-1928, 79 y.o.   MRN: [id number]   Cynthia Pizza, MD 1o NEPHRO: DR. Fausto Skillern  HPI MAY HAVE A FLARE 2X IN PAST YEAR-DEC 2013 AND 2014. CAN'T IDENTIFY TRIGGERS. TAKING A PROBIOTIC ([place]). USING BENTYL PRN. NO CHOKING SPELLS. SAW KIDNEY DOC THIS WEEK. FEELING WEAK AND UNSTEADY ON HER FEET. HAVING PROBLEMS WITH K, PHOS, AND CA.   Past Medical History  Diagnosis Date  . Barrett's esophagus 2005    2008: EGD/NUR-EROSIVE ESOPHAGITIS;  . Irritable bowel syndrome   . Diverticulitis     required colostomy-1993  . Hypertension   . Arteriosclerotic cardiovascular disease (ASCVD)     CABG surgery in 5/96; non-Q MI with normal EF in 04/2003; negative stress nuclear in 02/2009  . Anxiety   . Hyperlipidemia   . Osteoporosis   . Cerebrovascular disease     Bilat. bruits; no focal disease 2000-2004; 50-69% R+ L ICA stenosis in 6/09 +plaque  . Tobacco abuse, in remission     Quit in 1992  . Chronic kidney disease (CKD), stage III (moderate)     with hyperparathyroidism   Past Surgical History  Procedure Laterality Date  . Colostomy  1993    and reversal; ruptured diverticulum  . Colonoscopy  2005  . Coronary artery bypass graft  1996    x5  . Ventral hernia repair      following incarceration with a small bowel obstruction  . Appendectomy    . Tonsillectomy    . Thyroidectomy, partial      Goiter  . Breast excisional biopsy      Benign mass  . Kidney surgery  Age 89  . Esophagogastroduodenoscopy  05/13/2007    Soft stricture at the GE junction with evidence of esophageal scarring and pseudodiverticular formation./  Soft stricture at GE junction which was dilated with a balloon up to 20 mm/  Moderate-sized sliding hiatal hernia.   Allergies  Allergen Reactions  . Doxycycline Hyclate     REACTION: Nausea, epigastric pain  . Iohexol Rash    Premedication required   . Other Rash    Adhesive Tape    Current  Outpatient Prescriptions  Medication Sig Dispense Refill  . acetaminophen (TYLENOL) 500 MG tablet Take 500 mg by mouth every 6 (six) hours as needed.        . ALPRAZolam (XANAX) 0.25 MG tablet Take 0.5 mg by mouth daily.       Marland Kitchen aspirin 81 MG tablet Take 81 mg by mouth daily.        Marland Kitchen atorvastatin (LIPITOR) 80 MG tablet TAKE ONE TABLET ONCE DAILY    . benazepril (LOTENSIN) 40 MG tablet Take 40 mg by mouth daily.      . calcium carbonate (OS-CAL) 600 MG TABS Take 3,600 mg by mouth daily.      . chlorthalidone (HYGROTON) 25 MG tablet Take 1 tablet (25 mg total) by mouth daily.    . cloNIDine (CATAPRES - DOSED IN MG/24 HR) 0.3 mg/24hr Place 1 patch (0.3 mg total) onto the skin once a week.    . dicyclomine (BENTYL) 10 MG capsule 1-2 PO 30  MINUTES PRIOR TO MEALS AND AT BEDTIME TO TREAT DIARRHEA AND ABD PAIN    . diltiazem (CARDIZEM CD) 180 MG 24 hr capsule Take 1 capsule (180 mg total) by mouth daily.    Marland Kitchen FIBER COMPLETE PO Take 2 tablets by mouth 2 (two) times  daily.        . lansoprazole (PREVACID) 30 MG capsule Take 30 mg by mouth daily.        . Multiple Vitamins-Minerals (CENTRUM SILVER PO) Take 1 tablet by mouth daily.        . Prenatal Vit-Fe Psac Cmplx-FA (POLY IRON PN PO) Take 1 tablet by mouth daily.        . raloxifene (EVISTA) 60 MG tablet Take 60 mg by mouth daily.        . traMADol (ULTRAM) 50 MG tablet Take 50 mg by mouth 2 (two) times daily.       . potassium chloride (KLOR-CON) 20 MEQ packet Take 20 mEq by mouth daily as needed.            Review of Systems     Objective:   Physical Exam  Vitals reviewed. Constitutional: She is oriented to person, place, and time. She appears well-nourished. No distress.  HENT:  Head: Normocephalic and atraumatic.  Mouth/Throat: Oropharynx is clear and moist. No oropharyngeal exudate.  Eyes: Pupils are equal, round, and reactive to light. No scleral icterus.  Neck: Normal range of motion. Neck supple.  Cardiovascular: Normal rate,  regular rhythm and normal heart sounds.   Pulmonary/Chest: Effort normal and breath sounds normal. No respiratory distress.  Abdominal: Soft. Bowel sounds are normal. She exhibits no distension. There is no tenderness.  Musculoskeletal: She exhibits edema.  Lymphadenopathy:    She has no cervical adenopathy.  Neurological: She is alert and oriented to person, place, and time.  NO  NEW FOCAL DEFICITS   Psychiatric: She has a normal mood and affect.          Assessment & Plan:

## 2013-06-18 NOTE — Progress Notes (Signed)
Cc PCP 

## 2013-06-18 NOTE — Progress Notes (Signed)
Reminder in epic °

## 2013-06-18 NOTE — Assessment & Plan Note (Signed)
SX FAIRLY WELL CONTROLLED.  CONTINUE A PROBIOTIC DAILY.  USE BENTYL AND PHENERGAN AS NEEDED.  FOLLOW UP IN ONE YEAR.

## 2013-06-18 NOTE — Patient Instructions (Signed)
CONTINUE YOUR PROBIOTIC DAILY.    USE BENTYL AND PHENERGAN AS NEEDED.   FOLLOW UP IN ONE YEAR.

## 2013-06-26 ENCOUNTER — Encounter: Payer: Self-pay | Admitting: Cardiology

## 2013-07-29 ENCOUNTER — Encounter: Payer: Self-pay | Admitting: Cardiology

## 2013-08-05 ENCOUNTER — Other Ambulatory Visit: Payer: Self-pay | Admitting: Cardiology

## 2013-11-18 ENCOUNTER — Other Ambulatory Visit: Payer: Self-pay | Admitting: Cardiology

## 2013-12-15 ENCOUNTER — Other Ambulatory Visit (HOSPITAL_COMMUNITY): Payer: Medicare Other

## 2013-12-16 ENCOUNTER — Ambulatory Visit (HOSPITAL_COMMUNITY): Payer: Medicare Other

## 2013-12-22 ENCOUNTER — Ambulatory Visit (HOSPITAL_COMMUNITY)
Admission: RE | Admit: 2013-12-22 | Discharge: 2013-12-22 | Disposition: A | Discharge status: Home / Self Care | Payer: Medicare Other | Source: Ambulatory Visit | Attending: Cardiology | Admitting: Cardiology

## 2013-12-22 DIAGNOSIS — I10 Essential (primary) hypertension: Secondary | ICD-10-CM | POA: Insufficient documentation

## 2013-12-22 DIAGNOSIS — I6350 Cerebral infarction due to unspecified occlusion or stenosis of unspecified cerebral artery: Secondary | ICD-10-CM | POA: Insufficient documentation

## 2013-12-22 DIAGNOSIS — I658 Occlusion and stenosis of other precerebral arteries: Secondary | ICD-10-CM | POA: Insufficient documentation

## 2013-12-22 DIAGNOSIS — R0989 Other specified symptoms and signs involving the circulatory and respiratory systems: Secondary | ICD-10-CM | POA: Insufficient documentation

## 2013-12-22 DIAGNOSIS — I6529 Occlusion and stenosis of unspecified carotid artery: Secondary | ICD-10-CM | POA: Insufficient documentation

## 2013-12-22 DIAGNOSIS — I350 Nonrheumatic aortic (valve) stenosis: Secondary | ICD-10-CM

## 2013-12-22 DIAGNOSIS — E119 Type 2 diabetes mellitus without complications: Secondary | ICD-10-CM | POA: Insufficient documentation

## 2013-12-22 DIAGNOSIS — K219 Gastro-esophageal reflux disease without esophagitis: Secondary | ICD-10-CM

## 2013-12-22 DIAGNOSIS — I2510 Atherosclerotic heart disease of native coronary artery without angina pectoris: Secondary | ICD-10-CM | POA: Insufficient documentation

## 2013-12-23 ENCOUNTER — Other Ambulatory Visit: Payer: Self-pay

## 2013-12-23 DIAGNOSIS — I10 Essential (primary) hypertension: Secondary | ICD-10-CM

## 2013-12-23 DIAGNOSIS — I639 Cerebral infarction, unspecified: Secondary | ICD-10-CM

## 2014-02-18 ENCOUNTER — Other Ambulatory Visit: Payer: Self-pay | Admitting: Cardiology

## 2014-05-14 ENCOUNTER — Other Ambulatory Visit: Payer: Self-pay | Admitting: Cardiology

## 2014-06-08 ENCOUNTER — Encounter: Payer: Self-pay | Admitting: *Deleted

## 2014-06-10 ENCOUNTER — Other Ambulatory Visit: Payer: Self-pay | Admitting: Cardiovascular Disease

## 2014-07-07 ENCOUNTER — Encounter (INDEPENDENT_AMBULATORY_CARE_PROVIDER_SITE_OTHER): Payer: Self-pay

## 2014-07-07 ENCOUNTER — Ambulatory Visit (INDEPENDENT_AMBULATORY_CARE_PROVIDER_SITE_OTHER): Payer: Medicare Other | Admitting: Gastroenterology

## 2014-07-07 ENCOUNTER — Encounter: Payer: Self-pay | Admitting: Gastroenterology

## 2014-07-07 VITALS — BP 111/75 | HR 82 | Temp 97.2°F | Ht 65.0 in | Wt 153.6 lb

## 2014-07-07 DIAGNOSIS — D649 Anemia, unspecified: Secondary | ICD-10-CM

## 2014-07-07 DIAGNOSIS — K589 Irritable bowel syndrome without diarrhea: Secondary | ICD-10-CM

## 2014-07-07 DIAGNOSIS — I6350 Cerebral infarction due to unspecified occlusion or stenosis of unspecified cerebral artery: Secondary | ICD-10-CM

## 2014-07-07 NOTE — Assessment & Plan Note (Signed)
SX CONTROLLED. LOST WEIGHT UNINTENTIONAL.  CONTINUE TO MONITOR SYMPTOMS. FOLLOW UP IN 1 YEAR.

## 2014-07-07 NOTE — Progress Notes (Signed)
Reminder in epic °

## 2014-07-07 NOTE — Progress Notes (Signed)
   Subjective:    Patient ID: [name], female    DOB: 12/28/1927, 80 y.o.   MRN: [id number]  [name], ZACH, MD  HPI NOT USING RX FOR DIARRHEA IN PAST YEAR. SOB WHEN SHE WALKS OR DOES WORK. BMS: EVERY DAY. PROBIOTIC HELPS. LOST 12 LBS IN PAST YEAR BUT HAS BEEN MODIFYING HER DIET. FELLING PAST YEAR AND HIT L EYE. BEING TREATED FOR ULCERS ON HER LEGS.  PT DENIES FEVER, CHILLS, HEMATOCHEZIA, nausea, vomiting, melena, diarrhea, CHEST PAIN, CHANGE IN BOWEL IN HABITS, constipation, abdominal pain, problems swallowing, OR heartburn or indigestion.  Past Medical History  Diagnosis Date  . Barrett's esophagus 2005    2008: EGD/NUR-EROSIVE ESOPHAGITIS;  . Irritable bowel syndrome   . Diverticulitis     required colostomy-1993  . Hypertension   . Arteriosclerotic cardiovascular disease (ASCVD)     CABG surgery in 5/96; non-Q MI with normal EF in 04/2003; negative stress nuclear in 02/2009  . Anxiety   . Hyperlipidemia   . Osteoporosis   . Cerebrovascular disease     Bilat. bruits; no focal disease 2000-2004; 50-69% R+ L ICA stenosis in 6/09 +plaque  . Tobacco abuse, in remission     Quit in 1992  . Chronic kidney disease (CKD), stage III (moderate)     with hyperparathyroidism    Past Surgical History  Procedure Laterality Date  . Colostomy  1993    and reversal; ruptured diverticulum  . Colonoscopy  2005  . Coronary artery bypass graft  1996    x5  . Ventral hernia repair      following incarceration with a small bowel obstruction  . Appendectomy    . Tonsillectomy    . Thyroidectomy, partial      Goiter  . Breast excisional biopsy      Benign mass  . Kidney surgery  Age 80  . Esophagogastroduodenoscopy  05/13/2007    Soft stricture at the GE junction with evidence of esophageal scarring and pseudodiverticular formation./  Soft stricture at GE junction which was dilated with a balloon up to 20 mm/  Moderate-sized sliding hiatal hernia.     Review of Systems       Objective:   Physical Exam  Vitals reviewed. Constitutional: She is oriented to person, place, and time. She appears well-developed and well-nourished. No distress.  HENT:  Head: Normocephalic and atraumatic.  Mouth/Throat: Oropharynx is clear and moist. No oropharyngeal exudate.  Eyes: Pupils are equal, round, and reactive to light. No scleral icterus.  Neck: Normal range of motion. Neck supple.  Cardiovascular: Normal rate, regular rhythm and normal heart sounds.   Pulmonary/Chest: Effort normal and breath sounds normal. No respiratory distress.  Abdominal: Soft. Bowel sounds are normal. She exhibits no distension. There is no tenderness.  Musculoskeletal: She exhibits no edema (support stocking BILATERAL LOWER EXTREMITIES).  Lymphadenopathy:    She has no cervical adenopathy.  Neurological: She is alert and oriented to person, place, and time.  Psychiatric: She has a normal mood and affect.          Assessment & Plan:

## 2014-07-07 NOTE — Assessment & Plan Note (Addendum)
NO ACTIVE GI BLEEDING.  CONTINUE TO MONITOR SYMPTOMS. OBTAIN LABS FROM DR. BEFAKADU. FOLLOW UP IN 1 YEAR.

## 2014-07-07 NOTE — Patient Instructions (Signed)
I WILL REVIEW LABS FROM DR. BEFAKADU.  FOLLOW UP IN 1 YEAR.

## 2014-07-08 ENCOUNTER — Telehealth: Payer: Self-pay | Admitting: Cardiovascular Disease

## 2014-07-08 MED ORDER — CLONIDINE HCL 0.3 MG/24HR TD PTWK: 0.3000 mg | MEDICATED_PATCH | TRANSDERMAL | Status: DC

## 2014-07-08 NOTE — Telephone Encounter (Signed)
Received fax refill request  Rx # [id number] Medication:  Clonidine TTS 0.3 mg patch Qty 4 Sig:  Apply one patch to skin once weekly Physician:  Wyline MoodBranch

## 2014-07-08 NOTE — Telephone Encounter (Signed)
Refill request complete,has apt with Dr.koneswaran(not Branch) next month

## 2014-07-09 NOTE — Progress Notes (Signed)
cc'd to pcp 

## 2014-07-22 ENCOUNTER — Ambulatory Visit: Payer: Medicare Other | Admitting: Cardiovascular Disease

## 2014-08-05 ENCOUNTER — Other Ambulatory Visit: Payer: Self-pay | Admitting: Adult Health

## 2014-08-18 ENCOUNTER — Other Ambulatory Visit: Payer: Self-pay | Admitting: Cardiovascular Disease

## 2014-08-18 NOTE — Telephone Encounter (Signed)
Medication sent to pharmacy  

## 2014-09-02 ENCOUNTER — Ambulatory Visit (INDEPENDENT_AMBULATORY_CARE_PROVIDER_SITE_OTHER): Payer: Medicare Other | Admitting: Cardiovascular Disease

## 2014-09-02 ENCOUNTER — Encounter: Payer: Self-pay | Admitting: Cardiovascular Disease

## 2014-09-02 VITALS — BP 130/78 | HR 68 | Ht 65.0 in | Wt 153.0 lb

## 2014-09-02 DIAGNOSIS — I4891 Unspecified atrial fibrillation: Secondary | ICD-10-CM

## 2014-09-02 DIAGNOSIS — Z7189 Other specified counseling: Secondary | ICD-10-CM

## 2014-09-02 DIAGNOSIS — I10 Essential (primary) hypertension: Secondary | ICD-10-CM

## 2014-09-02 DIAGNOSIS — I6350 Cerebral infarction due to unspecified occlusion or stenosis of unspecified cerebral artery: Secondary | ICD-10-CM

## 2014-09-02 DIAGNOSIS — I6529 Occlusion and stenosis of unspecified carotid artery: Secondary | ICD-10-CM

## 2014-09-02 DIAGNOSIS — N183 Chronic kidney disease, stage 3 unspecified: Secondary | ICD-10-CM

## 2014-09-02 DIAGNOSIS — E119 Type 2 diabetes mellitus without complications: Secondary | ICD-10-CM

## 2014-09-02 DIAGNOSIS — I6523 Occlusion and stenosis of bilateral carotid arteries: Secondary | ICD-10-CM

## 2014-09-02 DIAGNOSIS — I25810 Atherosclerosis of coronary artery bypass graft(s) without angina pectoris: Secondary | ICD-10-CM

## 2014-09-02 DIAGNOSIS — E785 Hyperlipidemia, unspecified: Secondary | ICD-10-CM

## 2014-09-02 DIAGNOSIS — I658 Occlusion and stenosis of other precerebral arteries: Secondary | ICD-10-CM

## 2014-09-02 MED ORDER — EDOXABAN TOSYLATE 30 MG PO TABS: 30.0000 mg | ORAL_TABLET | Freq: Every day | ORAL | Status: DC

## 2014-09-02 NOTE — Patient Instructions (Addendum)
Your physician wants you to follow-up in: 6 months You will receive a reminder letter in the mail two months in advance. If you don't receive a letter, please call our office to schedule the follow-up appointment.   Your physician has recommended you make the following change in your medication:    STOP Aspirin   START Savaysa 30 mg daily 30 day supply samples given plus card for free 30 day supply   Please get FASTING blood work in 1 week (CBC,BMET,LIPID,LFT'S)     Thank you for choosing [facility] !

## 2014-09-02 NOTE — Progress Notes (Signed)
Patient ID: [name], female   DOB: [birth year], 80 y.o.   MRN: [id number]      SUBJECTIVE: The patient is an 80 year old woman with a history of CABG in 1996, bilateral carotid artery stenosis, anemia, hyperlipidemia, type 2 diabetes mellitus and chronic kidney disease stage III. She denies chest pain, shortness of breath, orthopnea, dizziness and paroxysmal nocturnal dyspnea. She has lower extremity wounds (left leg) and is managed by the wound center in [place]. She denies palpitations. ECG in the office today demonstrates atrial fibrillation, heart rate 76 beats per minute.   Review of Systems: As per "subjective", otherwise negative.  Allergies  Allergen Reactions  . Doxycycline Hyclate     REACTION: Nausea, epigastric pain  . Iohexol Rash    Premedication required   . Other Rash    Adhesive Tape    Current Outpatient Prescriptions  Medication Sig Dispense Refill  . acetaminophen (TYLENOL) 500 MG tablet Take 500 mg by mouth every 6 (six) hours as needed.        . ALPRAZolam (XANAX) 0.25 MG tablet Take 0.5 mg by mouth daily.       Marland Kitchen aspirin 81 MG tablet Take 81 mg by mouth daily.        Marland Kitchen atorvastatin (LIPITOR) 80 MG tablet TAKE ONE TABLET BY MOUTH ONCE DAILY  30 tablet  3  . calcitRIOL (ROCALTROL) 0.25 MCG capsule Take 0.25 mcg by mouth daily.      . calcium carbonate (OS-CAL) 600 MG TABS Take 3,600 mg by mouth daily.        . cloNIDine (CATAPRES - DOSED IN MG/24 HR) 0.3 mg/24hr patch Place 1 patch (0.3 mg total) onto the skin once a week.  4 patch  3  . dicyclomine (BENTYL) 10 MG capsule 1-2 PO 30  MINUTES PRIOR TO MEALS AND AT BEDTIME TO TREAT DIARRHEA AND ABD PAIN  60 capsule  1  . diltiazem (CARDIZEM CD) 180 MG 24 hr capsule TAKE ONE CAPSULE BY MOUTH DAILY  90 capsule  3  . FIBER COMPLETE PO Take 2 tablets by mouth 2 (two) times daily.        . furosemide (LASIX) 20 MG tablet Take 20 mg by mouth.      . gabapentin (NEURONTIN) 100 MG capsule Take 100 mg by mouth at  bedtime.      . iron polysaccharides (NIFEREX) 150 MG capsule Take 150 mg by mouth daily.      . lansoprazole (PREVACID) 30 MG capsule Take 30 mg by mouth daily.        Marland Kitchen linagliptin (TRADJENTA) 5 MG TABS tablet Take 5 mg by mouth daily.      . Multiple Vitamins-Minerals (CENTRUM SILVER PO) Take 1 tablet by mouth daily.        . raloxifene (EVISTA) 60 MG tablet Take 60 mg by mouth daily.        . traMADol (ULTRAM) 50 MG tablet Take 50 mg by mouth daily.       . chlorthalidone (HYGROTON) 25 MG tablet Take 1 tablet (25 mg total) by mouth daily.  30 tablet  11   No current facility-administered medications for this visit.    Past Medical History  Diagnosis Date  . Barrett's esophagus 2005    2008: EGD/NUR-EROSIVE ESOPHAGITIS;  . Irritable bowel syndrome   . Diverticulitis     required colostomy-1993  . Hypertension   . Arteriosclerotic cardiovascular disease (ASCVD)     CABG surgery in 5/96; non-Q MI  with normal EF in 04/2003; negative stress nuclear in 02/2009  . Anxiety   . Hyperlipidemia   . Osteoporosis   . Cerebrovascular disease     Bilat. bruits; no focal disease 2000-2004; 50-69% R+ L ICA stenosis in 6/09 +plaque  . Tobacco abuse, in remission     Quit in 1992  . Chronic kidney disease (CKD), stage III (moderate)     with hyperparathyroidism    Past Surgical History  Procedure Laterality Date  . Colostomy  1993    and reversal; ruptured diverticulum  . Colonoscopy  2005  . Coronary artery bypass graft  1996    x5  . Ventral hernia repair      following incarceration with a small bowel obstruction  . Appendectomy    . Tonsillectomy    . Thyroidectomy, partial      Goiter  . Breast excisional biopsy      Benign mass  . Kidney surgery  Age 80  . Esophagogastroduodenoscopy  05/13/2007    Soft stricture at the GE junction with evidence of esophageal scarring and pseudodiverticular formation./  Soft stricture at GE junction which was dilated with a balloon up to 20 mm/   Moderate-sized sliding hiatal hernia.    History   Social History  . Marital Status: Widowed    Spouse Name: N/A    Number of Children: N/A  . Years of Education: N/A   Occupational History  . [employer]     Retired   Social History Main Topics  . Smoking status: Former Smoker -- 1.00 packs/day for 30 years    Quit date: 09/03/1991  . Smokeless tobacco: Never Used     Comment: Quit smoking in 1991  . Alcohol Use: No  . Drug Use: No  . Sexual Activity: No   Other Topics Concern  . Not on file   Social History Narrative   Resides with son     Ceasar Mons Vitals:   09/02/14 1301  BP: 130/78  Pulse: 68  Height:  (1.651 m)  Weight: 153 lb (69.4 kg)    PHYSICAL EXAM General: NAD HEENT: Normal. Neck: No JVD, no thyromegaly. Lungs: Diminished sounds but clear with normal respiratory effort. CV: Nondisplaced PMI.  Irregular rhythm, normal S1/S2, no S3, no murmur. Legs are bandaged.  Bilateral carotid bruits.   Abdomen: Soft, nontender, no hepatosplenomegaly, no distention.  Neurologic: Alert and oriented x 3.  Psych: Normal affect. Skin: Normal. Musculoskeletal: Normal range of motion, no gross deformities. Extremities: No clubbing or cyanosis of upper extremities.  ECG: Most recent ECG reviewed.      ASSESSMENT AND PLAN: 1. CAD/CABG: Stable ischemic heart disease. Continue statin therapy. As I will be starting a target specific anticoagulant for atrial fibrillation, I will discontinue aspirin. 2. Carotid artery stenosis: Moderate 50-69% bilaterally in January 2015. Will continue annual surveillance. 3. Hyperlipidemia: Will check lipids and LFT's if not recently performed by PCP. Continue high intensity statin therapy. 4. Type 2 diabetes: Continue therapy with linagliptin. Managed by PCP. 5. CKD stage 3: Will check BMET in one week. 6. New onset atrial fibrillation: The patient is entirely asymptomatic and heart rate is controlled on long-acting diltiazem 180 mg  daily. CHADS-VASC score of 5, thus at high risk for CVA. Will d/c ASA and start Savaysa 30 mg daily. Check CBC in one week along with BMET.  Dispo: f/u 6 months.  Time spent: 40 minutes, >50% spent on anticoagulation counseling and atrial fibrillation.  Prentice Docker, M.D.,  F.A.C.C.

## 2014-09-04 ENCOUNTER — Telehealth: Payer: Self-pay | Admitting: Cardiovascular Disease

## 2014-09-04 NOTE — Telephone Encounter (Signed)
Please see refill bin / tgs  °

## 2014-09-04 NOTE — Telephone Encounter (Signed)
PA request faxed to [facility]

## 2014-09-07 ENCOUNTER — Other Ambulatory Visit: Payer: Self-pay

## 2014-09-07 ENCOUNTER — Telehealth: Payer: Self-pay | Admitting: *Deleted

## 2014-09-07 ENCOUNTER — Telehealth: Payer: Self-pay

## 2014-09-07 NOTE — Telephone Encounter (Signed)
Pt already approved for [facility]

## 2014-09-07 NOTE — Telephone Encounter (Signed)
SAVAYSA APPROVED THROUGH 12/10/2014  GPI/NDC [id number]   PT ID NUMBER [id number]  SPOKE WITH BETH AT [facility],WILL BE AVAILABLE TOMORROW    PT MADE AWARE

## 2014-09-07 NOTE — Telephone Encounter (Signed)
Fax from [facility] for prior auth for savaysa in bin /tmj

## 2014-09-11 ENCOUNTER — Telehealth: Payer: Self-pay | Admitting: *Deleted

## 2014-09-11 NOTE — Telephone Encounter (Signed)
Received labs in Dr. Caesar ChestnutKoneswarans folder

## 2014-09-28 ENCOUNTER — Encounter: Payer: Self-pay | Admitting: Cardiovascular Disease

## 2014-10-27 ENCOUNTER — Other Ambulatory Visit: Payer: Self-pay | Admitting: Adult Health

## 2014-12-05 ENCOUNTER — Other Ambulatory Visit: Payer: Self-pay | Admitting: Cardiovascular Disease

## 2014-12-29 ENCOUNTER — Telehealth: Payer: Self-pay | Admitting: *Deleted

## 2014-12-29 NOTE — Telephone Encounter (Signed)
Pt insurance is not wanting to pay for favayfa they want to know if she can take something else/ [employer] is her insurance company [phone]

## 2015-02-22 ENCOUNTER — Encounter: Payer: Self-pay | Admitting: Cardiovascular Disease

## 2015-02-22 ENCOUNTER — Ambulatory Visit (INDEPENDENT_AMBULATORY_CARE_PROVIDER_SITE_OTHER): Payer: Medicare Other | Admitting: Cardiovascular Disease

## 2015-02-22 VITALS — BP 184/60 | HR 55 | Ht 66.0 in | Wt 158.0 lb

## 2015-02-22 DIAGNOSIS — I6523 Occlusion and stenosis of bilateral carotid arteries: Secondary | ICD-10-CM

## 2015-02-22 DIAGNOSIS — N183 Chronic kidney disease, stage 3 unspecified: Secondary | ICD-10-CM

## 2015-02-22 DIAGNOSIS — I25810 Atherosclerosis of coronary artery bypass graft(s) without angina pectoris: Secondary | ICD-10-CM | POA: Diagnosis not present

## 2015-02-22 DIAGNOSIS — E785 Hyperlipidemia, unspecified: Secondary | ICD-10-CM | POA: Diagnosis not present

## 2015-02-22 DIAGNOSIS — I350 Nonrheumatic aortic (valve) stenosis: Secondary | ICD-10-CM

## 2015-02-22 DIAGNOSIS — I10 Essential (primary) hypertension: Secondary | ICD-10-CM | POA: Diagnosis not present

## 2015-02-22 MED ORDER — CHLORTHALIDONE 25 MG PO TABS: 25.0000 mg | ORAL_TABLET | Freq: Every day | ORAL | Status: DC

## 2015-02-22 MED ORDER — CLONIDINE HCL 0.1 MG PO TABS: 0.1000 mg | ORAL_TABLET | Freq: Two times a day (BID) | ORAL | Status: DC

## 2015-02-22 NOTE — Patient Instructions (Addendum)
Your physician wants you to follow-up in: 6 months with Dr Reggy EyeKoneswaran You will receive a reminder letter in the mail two months in advance. If you don't receive a letter, please call our office to schedule the follow-up appointment.  Your physician has recommended you make the following change in your medication:   STOP Clonidine patch  START Clonidine 0.1 mg tablet twice a day   Please get lab work in 1 week  Your physician has requested that you have a carotid duplex. This test is an ultrasound of the carotid arteries in your neck. It looks at blood flow through these arteries that supply the brain with blood. Allow one hour for this exam. There are no restrictions or special instructions.    Thank you for choosing [facility] !

## 2015-02-22 NOTE — Progress Notes (Signed)
Patient ID: [name], female   DOB: December 15, 1927, 81 y.o.   MRN: [id number]      SUBJECTIVE: The patient is an 81 year old woman with a history of CABG in 1996, bilateral carotid artery stenosis, atrial fibrillation, anemia, hyperlipidemia, type 2 diabetes mellitus and chronic kidney disease stage III. She is feeling well and denies chest pain and palpitations. She also denies bleeding problems with Eliquis. She has chronic exertional dyspnea which has not been problematic for her. She is no longer on chlorthalidone. She said her blood pressures at home run in the 135 systolic range.  I personally reviewed labs performed on 01/18/15 which demonstrated hemoglobin 12.1, platelets 179, BUN 25, creatinine 1.7, potassium 4.3, sodium 139, calcium 10.8, albumin 4.0, total cholesterol 178, triglycerides 154, HDL 62, LDL 85, HbA1c 6.5%.   Review of Systems: As per "subjective", otherwise negative.  Allergies  Allergen Reactions  . Doxycycline Hyclate     REACTION: Nausea, epigastric pain  . Iohexol Rash    Premedication required   . Other Rash    Adhesive Tape    Current Outpatient Prescriptions  Medication Sig Dispense Refill  . acetaminophen (TYLENOL) 500 MG tablet Take 500 mg by mouth every 6 (six) hours as needed.      . ALPRAZolam (XANAX) 0.25 MG tablet Take 0.5 mg by mouth daily.     Marland Kitchen atorvastatin (LIPITOR) 80 MG tablet TAKE ONE TABLET BY MOUTH DAILY 30 tablet 3  . calcitRIOL (ROCALTROL) 0.25 MCG capsule Take 0.25 mcg by mouth daily.    . calcium carbonate (OS-CAL) 600 MG TABS Take 3,600 mg by mouth daily.      . chlorthalidone (HYGROTON) 25 MG tablet Take 1 tablet (25 mg total) by mouth daily. 30 tablet 11  . cloNIDine (CATAPRES - DOSED IN MG/24 HR) 0.3 mg/24hr patch PLACE ONE PATCH ONTO THE SKIN ONCE A WEEK 4 patch 6  . dicyclomine (BENTYL) 10 MG capsule 1-2 PO 30  MINUTES PRIOR TO MEALS AND AT BEDTIME TO TREAT DIARRHEA AND ABD PAIN 60 capsule 1  . diltiazem (CARDIZEM CD) 180 MG 24  hr capsule TAKE ONE CAPSULE BY MOUTH DAILY 90 capsule 3  . ELIQUIS 5 MG TABS tablet Take 5 mg by mouth 2 (two) times daily.  5  . FIBER COMPLETE PO Take 2 tablets by mouth 2 (two) times daily.      . furosemide (LASIX) 20 MG tablet Take 20 mg by mouth.    . gabapentin (NEURONTIN) 100 MG capsule Take 100 mg by mouth at bedtime.    . iron polysaccharides (NIFEREX) 150 MG capsule Take 150 mg by mouth daily.    . lansoprazole (PREVACID) 30 MG capsule Take 30 mg by mouth daily.      Marland Kitchen linagliptin (TRADJENTA) 5 MG TABS tablet Take 5 mg by mouth daily.    . Multiple Vitamins-Minerals (CENTRUM SILVER PO) Take 1 tablet by mouth daily.      . raloxifene (EVISTA) 60 MG tablet Take 60 mg by mouth daily.      . traMADol (ULTRAM) 50 MG tablet Take 50 mg by mouth daily.      No current facility-administered medications for this visit.    Past Medical History  Diagnosis Date  . Barrett's esophagus 2005    2008: EGD/NUR-EROSIVE ESOPHAGITIS;  . Irritable bowel syndrome   . Diverticulitis     required colostomy-1993  . Hypertension   . Arteriosclerotic cardiovascular disease (ASCVD)     CABG surgery in 5/96; non-Q  MI with normal EF in 04/2003; negative stress nuclear in 02/2009  . Anxiety   . Hyperlipidemia   . Osteoporosis   . Cerebrovascular disease     Bilat. bruits; no focal disease 2000-2004; 50-69% R+ L ICA stenosis in 6/09 +plaque  . Tobacco abuse, in remission     Quit in 1992  . Chronic kidney disease (CKD), stage III (moderate)     with hyperparathyroidism    Past Surgical History  Procedure Laterality Date  . Colostomy  1993    and reversal; ruptured diverticulum  . Colonoscopy  2005  . Coronary artery bypass graft  1996    x5  . Ventral hernia repair      following incarceration with a small bowel obstruction  . Appendectomy    . Tonsillectomy    . Thyroidectomy, partial      Goiter  . Breast excisional biopsy      Benign mass  . Kidney surgery  Age 81  .  Esophagogastroduodenoscopy  05/13/2007    Soft stricture at the GE junction with evidence of esophageal scarring and pseudodiverticular formation./  Soft stricture at GE junction which was dilated with a balloon up to 20 mm/  Moderate-sized sliding hiatal hernia.    History   Social History  . Marital Status: Widowed    Spouse Name: N/A  . Number of Children: N/A  . Years of Education: N/A   Occupational History  . [employer]     Retired   Social History Main Topics  . Smoking status: Former Smoker -- 1.00 packs/day for 30 years    Types: Cigarettes    Start date: 02/22/1952    Quit date: 09/03/1991  . Smokeless tobacco: Never Used     Comment: Quit smoking in 1991  . Alcohol Use: No  . Drug Use: No  . Sexual Activity: No   Other Topics Concern  . Not on file   Social History Narrative   Resides with son     Ceasar Mons Vitals:   02/22/15 1417  BP: 184/60  Pulse: 55  Height:  (1.676 m)  Weight: 158 lb (71.668 kg)  SpO2: 96%    PHYSICAL EXAM General: NAD HEENT: Normal. Neck: No JVD, no thyromegaly. Lungs: Diminished sounds but clear with normal respiratory effort. CV: Nondisplaced PMI. Irregular rhythm, normal S1/S2, no S3, II/VI systolic murmur over right upper sternal border. No pretibial edema.   Abdomen: Soft, nontender, no distention.  Neurologic: Alert and oriented.  Psych: Normal affect. Skin: Normal. Musculoskeletal: No gross deformities. Extremities: No clubbing or cyanosis of upper extremities.  ECG: Most recent ECG reviewed.      ASSESSMENT AND PLAN: 1. CAD/CABG: Stable ischemic heart disease. Continue statin therapy. No aspirin as she is on anticoagulant therapy for atrial fibrillation. 2. Carotid artery stenosis: Moderate 50-69% bilaterally in January 2015. Will repeat Dopplers. 3. Hyperlipidemia: LDL 85 on 01/18/15. Continue high intensity statin therapy. 4. Type 2 diabetes: HbA1C 6.5%. Continue therapy with linagliptin. Managed by  PCP. 5. CKD stage 3: Stable, creatinine 1.7 on 2/8. Given refill of chlorthalidone, will repeat BMET in one week. 6. Atrial fibrillation: The patient is entirely asymptomatic and heart rate is controlled on long-acting diltiazem 180 mg daily. CHADS-VASC score of 5, thus at high risk for CVA. Will continue apixaban. 7. Essential hypertension: Elevated. On clonidine patch which she would like to d/c due to skin reaction. She ran out of chlorthalidone or it may have been d/c (unclear). She says SBP in  130 mmHg range at home. Will start clonidine 0.1 mg bid and chlorthalidone 25 mg daily and check BMET in one week.  Dispo: f/u 6 months.   Prentice DockerSuresh Everett Ehrler, M.D., F.A.C.C.

## 2015-02-22 NOTE — Addendum Note (Signed)
Addended by: Marlyn CorporalARLTON, CATHERINE A on: 02/22/2015 03:10 PM   Modules accepted: Orders

## 2015-02-26 ENCOUNTER — Ambulatory Visit (HOSPITAL_COMMUNITY)
Admission: RE | Admit: 2015-02-26 | Discharge: 2015-02-26 | Disposition: A | Discharge status: Home / Self Care | Payer: Medicare Other | Source: Ambulatory Visit | Attending: Cardiovascular Disease | Admitting: Cardiovascular Disease

## 2015-02-26 DIAGNOSIS — Z8673 Personal history of transient ischemic attack (TIA), and cerebral infarction without residual deficits: Secondary | ICD-10-CM | POA: Diagnosis not present

## 2015-02-26 DIAGNOSIS — E785 Hyperlipidemia, unspecified: Secondary | ICD-10-CM | POA: Insufficient documentation

## 2015-02-26 DIAGNOSIS — Z87891 Personal history of nicotine dependence: Secondary | ICD-10-CM | POA: Diagnosis not present

## 2015-02-26 DIAGNOSIS — I10 Essential (primary) hypertension: Secondary | ICD-10-CM | POA: Insufficient documentation

## 2015-02-26 DIAGNOSIS — R0989 Other specified symptoms and signs involving the circulatory and respiratory systems: Secondary | ICD-10-CM | POA: Insufficient documentation

## 2015-02-26 DIAGNOSIS — E119 Type 2 diabetes mellitus without complications: Secondary | ICD-10-CM | POA: Insufficient documentation

## 2015-02-26 DIAGNOSIS — I2510 Atherosclerotic heart disease of native coronary artery without angina pectoris: Secondary | ICD-10-CM | POA: Diagnosis not present

## 2015-02-26 DIAGNOSIS — I6523 Occlusion and stenosis of bilateral carotid arteries: Secondary | ICD-10-CM | POA: Insufficient documentation

## 2015-03-02 ENCOUNTER — Telehealth: Payer: Self-pay

## 2015-03-02 DIAGNOSIS — N183 Chronic kidney disease, stage 3 (moderate): Secondary | ICD-10-CM

## 2015-03-02 LAB — BASIC METABOLIC PANEL
BUN: 33 mg/dL — ABNORMAL HIGH (ref 6–23)
CALCIUM: 9 mg/dL (ref 8.4–10.5)
CHLORIDE: 93 meq/L — AB (ref 96–112)
CO2: 30 mEq/L (ref 19–32)
CREATININE: 1.73 mg/dL — AB (ref 0.50–1.10)
Glucose, Bld: 178 mg/dL — ABNORMAL HIGH (ref 70–99)
POTASSIUM: 3.8 meq/L (ref 3.5–5.3)
SODIUM: 135 meq/L (ref 135–145)

## 2015-03-02 MED ORDER — APIXABAN 2.5 MG PO TABS: 2.5000 mg | ORAL_TABLET | Freq: Two times a day (BID) | ORAL | Status: DC

## 2015-03-02 NOTE — Telephone Encounter (Signed)
i spoke with patient,she will reduce Eliquis to 2.5 mg twice a day,new rx escribed,lab slip mailed

## 2015-03-02 NOTE — Telephone Encounter (Signed)
-----   Message from Laqueta LindenSuresh A Koneswaran, MD sent at 03/02/2015 10:37 AM EDT ----- Reduce Eliquis to 2.5 mg bid. Creatinine stable when compared to early February labs (also 1.7). BUN slightly up from 25. Continue chlorthalidone at present dose and repeat BMET in 2 weeks to see if this worsens.

## 2015-03-12 ENCOUNTER — Telehealth: Payer: Self-pay

## 2015-03-12 NOTE — Telephone Encounter (Signed)
PA approve By [facility] rep [place] for 03/12/15 thru 03/11/16-per rep, pt will receive letter  [facility] notified

## 2015-03-16 LAB — BASIC METABOLIC PANEL
BUN: 33 mg/dL — ABNORMAL HIGH (ref 6–23)
CO2: 32 mEq/L (ref 19–32)
Calcium: 8.5 mg/dL (ref 8.4–10.5)
Chloride: 91 mEq/L — ABNORMAL LOW (ref 96–112)
Creat: 1.56 mg/dL — ABNORMAL HIGH (ref 0.50–1.10)
Glucose, Bld: 112 mg/dL — ABNORMAL HIGH (ref 70–99)
POTASSIUM: 3.7 meq/L (ref 3.5–5.3)
Sodium: 134 mEq/L — ABNORMAL LOW (ref 135–145)

## 2015-04-08 ENCOUNTER — Other Ambulatory Visit: Payer: Self-pay | Admitting: Cardiovascular Disease

## 2015-05-26 ENCOUNTER — Encounter: Payer: Self-pay | Admitting: Gastroenterology

## 2015-07-01 ENCOUNTER — Encounter: Payer: Self-pay | Admitting: Gastroenterology

## 2015-07-01 ENCOUNTER — Ambulatory Visit (INDEPENDENT_AMBULATORY_CARE_PROVIDER_SITE_OTHER): Payer: Medicare Other | Admitting: Gastroenterology

## 2015-07-01 VITALS — BP 132/72 | HR 81 | Temp 97.1°F | Ht 65.0 in | Wt 152.2 lb

## 2015-07-01 DIAGNOSIS — I6523 Occlusion and stenosis of bilateral carotid arteries: Secondary | ICD-10-CM

## 2015-07-01 DIAGNOSIS — K219 Gastro-esophageal reflux disease without esophagitis: Secondary | ICD-10-CM | POA: Diagnosis not present

## 2015-07-01 DIAGNOSIS — D649 Anemia, unspecified: Secondary | ICD-10-CM | POA: Diagnosis not present

## 2015-07-01 DIAGNOSIS — K589 Irritable bowel syndrome without diarrhea: Secondary | ICD-10-CM

## 2015-07-01 NOTE — Assessment & Plan Note (Signed)
SYMPTOMS FAIRLY WELL CONTROLLED WITH OCCASIONAL NAUSEA WHEN BENDING OVER TO TEND TO FLOWERS.  CONTINUE TO MONITOR SYMPTOMS. PREVACID DAILY FOLLOW UP IN 6-12 MOS.

## 2015-07-01 NOTE — Assessment & Plan Note (Signed)
SYMPTOMS FAIRLY WELL CONTROLLED.  BENTYL PRN FOR LOOSE STOOLS, ABD PAIN, OR DIARRHEA CONTINUE TO MONITOR SYMPTOMS. FOLLOW UP IN 6-12 MOS.

## 2015-07-01 NOTE — Progress Notes (Signed)
Subjective:    Patient ID: [name], female    DOB: Apr 15, 1928, 81 y.o.   MRN: [id number]  Cynthia Pizza, MD  HPI WEIGHT JUL 2015: 152 LBS Bends over to tend to flowers and makes her feel nauseated. CHANGED LANSOPRAZOLE JAN 2016 AND HAD BURPING TRIED TO CHANGE PANTOPRAZOLE BUT IT WAS WORSE. STILL BURPING AND SYMPTOMS NOT CONTROLLED. STOPPED LANSOPRAZOLE. RARE BENTYL-ONLY A COUPLE OF TIMES FOR LOOSE STOOLS/ABD PAIN. TOOK TWO AND IT HELPED. HAS BM 3-4 TIMES A DAY. MAY GET COLD WITH A/C ON. TROUBLE STEPPING DOWN DUE TO SKIN GRAFT ON LEFT LEG. SOB: SAME AS ALWAYS.   PT DENIES FEVER, CHILLS, HEMATOCHEZIA, nausea, vomiting, melena, diarrhea, CHEST PAIN, CHANGE IN BOWEL IN HABITS, constipation, abdominal pain, problems swallowing, OR heartburn or indigestion.  Past Medical History  Diagnosis Date  . Barrett's esophagus 2005    2008: EGD/NUR-EROSIVE ESOPHAGITIS;  . Irritable bowel syndrome   . Diverticulitis     required colostomy-1993  . Hypertension   . Arteriosclerotic cardiovascular disease (ASCVD)     CABG surgery in 5/96; non-Q MI with normal EF in 04/2003; negative stress nuclear in 02/2009  . Anxiety   . Hyperlipidemia   . Osteoporosis   . Cerebrovascular disease     Bilat. bruits; no focal disease 2000-2004; 50-69% R+ L ICA stenosis in 6/09 +plaque  . Tobacco abuse, in remission     Quit in 1992  . Chronic kidney disease (CKD), stage III (moderate)     with hyperparathyroidism    Past Surgical History  Procedure Laterality Date  . Colostomy  1993    and reversal; ruptured diverticulum  . Colonoscopy  2005  . Coronary artery bypass graft  1996    x5  . Ventral hernia repair      following incarceration with a small bowel obstruction  . Appendectomy    . Tonsillectomy    . Thyroidectomy, partial      Goiter  . Breast excisional biopsy      Benign mass  . Kidney surgery  Age 34  . Esophagogastroduodenoscopy  05/13/2007    Soft stricture at the GE junction with  evidence of esophageal scarring and pseudodiverticular formation./  Soft stricture at GE junction which was dilated with a balloon up to 20 mm/  Moderate-sized sliding hiatal hernia.    Allergies  Allergen Reactions  . Doxycycline Hyclate     REACTION: Nausea, epigastric pain  . Iohexol Rash    Premedication required   . Other Rash    Adhesive Tape   Current Outpatient Prescriptions  Medication Sig Dispense Refill  . acetaminophen (TYLENOL) 500 MG tablet Take 500 mg by mouth every 6 (six) hours as needed.      . ALPRAZolam (XANAX) 0.25 MG tablet Take 0.5 mg by mouth daily.     Marland Kitchen apixaban (ELIQUIS) 2.5 MG TABS tablet Take 1 tablet (2.5 mg total) by mouth 2 (two) times daily.    Marland Kitchen atorvastatin (LIPITOR) 80 MG tablet TAKE ONE TABLET BY MOUTH DAILY    . calcitRIOL (ROCALTROL) 0.25 MCG capsule Take 0.25 mcg by mouth daily.    . calcium carbonate (OS-CAL) 600 MG TABS Take 3,600 mg by mouth daily.      . chlorthalidone (HYGROTON) 25 MG tablet Take 1 tablet (25 mg total) by mouth daily.    . cloNIDine (CATAPRES) 0.1 MG tablet Take 1 tablet (0.1 mg total) by mouth 2 (two) times daily.    Marland Kitchen dicyclomine 10 MG capsule  1-2 PO 30  QAC/HS  RARE PRN   . diltiazem (CARDIZEM CD) 180 MG 24 hr capsule TAKE ONE CAPSULE BY MOUTH DAILY    . [employer] Take 2 tablets by mouth 2 (two) times daily.      . furosemide (LASIX) 20 MG tablet Take 20 mg by mouth.    . gabapentin (NEURONTIN) 100 MG capsule Take 100 mg by mouth at bedtime.    . iron polysaccharides (NIFEREX) 150 MG capsule Take 150 mg by mouth daily.    . lansoprazole (PREVACID) 30 MG capsule Take 30 mg by mouth daily.      Marland Kitchen linagliptin (TRADJENTA) 5 MG TABS tablet Take 5 mg by mouth daily.    Marland Kitchen lisinopril (PRINIVIL,ZESTRIL) 2.5 MG tablet Take 2.5 mg by mouth daily.    . Multiple Vitamins-Minerals (CENTRUM SILVER PO) Take 1 tablet by mouth daily.      . Probiotic Product (PROBIOTIC DAILY PO) Take by mouth.    .      . raloxifene  (EVISTA) 60 MG tablet Take 60 mg by mouth daily.      . traMADol (ULTRAM) 50 MG tablet Take 50 mg by mouth daily.      Review of Systems     Objective:   Physical Exam        Assessment & Plan:

## 2015-07-01 NOTE — Progress Notes (Signed)
CC'ED TO PCP 

## 2015-07-01 NOTE — Assessment & Plan Note (Signed)
NO BRBPR OR MELENA.   OBTAIN LABS FROM MAY 2016. CONTINUE TO MONITOR SYMPTOMS. FOLLOW UP IN 6-12 MOS.

## 2015-07-01 NOTE — Progress Notes (Signed)
ON RECALL  °

## 2015-07-01 NOTE — Patient Instructions (Signed)
YOUR NAUSEA WHEN YOU BEND OVER IS PROBABLY FROM A  LITTLE ACID SPLASHING ON YOUR ESOPHAGUS. YOU COULD USE SOME MYLANTA OR TUMS BEFORE YOU GARDEN TO REDUCE THE CHANCE OF NAUSEA.  USE DICYCLOMINE AS NEEDED FOR LOOSE STOOLS, ABD PAIN, OR WATERY STOOL.  CONTINUE PREVACID. TAKE 30 MINUTES PRIOR TO BREAKFAST.  FOLLOW UP IN 6-12 MOS.

## 2015-07-22 ENCOUNTER — Other Ambulatory Visit: Payer: Self-pay | Admitting: Nurse Practitioner

## 2015-07-22 ENCOUNTER — Telehealth: Payer: Self-pay | Admitting: Gastroenterology

## 2015-07-22 MED ORDER — DICYCLOMINE HCL 10 MG PO CAPS: ORAL_CAPSULE | ORAL | Status: AC

## 2015-07-22 NOTE — Telephone Encounter (Signed)
Routing to refill box  

## 2015-07-22 NOTE — Telephone Encounter (Signed)
Refill completed.

## 2015-07-22 NOTE — Telephone Encounter (Signed)
Patient called today asking if SF would send her another prescription of dicyclomine 10 mg into [facility] in [place].

## 2015-08-10 ENCOUNTER — Other Ambulatory Visit: Payer: Self-pay | Admitting: Cardiovascular Disease

## 2015-09-09 ENCOUNTER — Ambulatory Visit (INDEPENDENT_AMBULATORY_CARE_PROVIDER_SITE_OTHER): Payer: Medicare Other | Admitting: Cardiovascular Disease

## 2015-09-09 VITALS — BP 130/72 | HR 82 | Ht 65.0 in | Wt 151.6 lb

## 2015-09-09 DIAGNOSIS — I482 Chronic atrial fibrillation, unspecified: Secondary | ICD-10-CM

## 2015-09-09 DIAGNOSIS — N183 Chronic kidney disease, stage 3 (moderate): Secondary | ICD-10-CM

## 2015-09-09 DIAGNOSIS — E785 Hyperlipidemia, unspecified: Secondary | ICD-10-CM

## 2015-09-09 DIAGNOSIS — I10 Essential (primary) hypertension: Secondary | ICD-10-CM

## 2015-09-09 DIAGNOSIS — I6523 Occlusion and stenosis of bilateral carotid arteries: Secondary | ICD-10-CM

## 2015-09-09 DIAGNOSIS — I25810 Atherosclerosis of coronary artery bypass graft(s) without angina pectoris: Secondary | ICD-10-CM

## 2015-09-09 NOTE — Progress Notes (Signed)
Patient ID: [name], female   DOB: [birth year], 81 y.o.   MRN: [id number]      SUBJECTIVE: The patient is an 81 year old woman with a history of CABG in 1996, bilateral carotid artery stenosis, atrial fibrillation, anemia, hyperlipidemia, type 2 diabetes mellitus and chronic kidney disease stage III. She is feeling well and denies chest pain and palpitations. She also denies bleeding problems with Eliquis. She has chronic exertional dyspnea which has not gotten worse.  I personally reviewed labs performed on 08/18/15 which demonstrated hemoglobin 12, platelets 213, sodium 138, potassium 4, BUN 25, creatinine 1.59, total cholesterol 176, triglycerides 141, HDL 55, LDL 93, HbA1c 6.8%.  ECG performed in the office today demonstrates atrial fibrillation, heart rate 67 bpm.   Review of Systems: As per "subjective", otherwise negative.  Allergies  Allergen Reactions  . Doxycycline Hyclate     REACTION: Nausea, epigastric pain  . Iohexol Rash    Premedication required   . Other Rash    Adhesive Tape    Current Outpatient Prescriptions  Medication Sig Dispense Refill  . acetaminophen (TYLENOL) 500 MG tablet Take 500 mg by mouth every 6 (six) hours as needed.      . ALPRAZolam (XANAX) 0.25 MG tablet Take 0.5 mg by mouth daily.     Marland Kitchen apixaban (ELIQUIS) 2.5 MG TABS tablet Take 1 tablet (2.5 mg total) by mouth 2 (two) times daily. 60 tablet 6  . atorvastatin (LIPITOR) 80 MG tablet TAKE ONE TABLET BY MOUTH DAILY 30 tablet 11  . calcitRIOL (ROCALTROL) 0.25 MCG capsule Take 0.25 mcg by mouth daily.    . calcium carbonate (OS-CAL) 600 MG TABS Take 3,600 mg by mouth daily.      . chlorthalidone (HYGROTON) 25 MG tablet Take 1 tablet (25 mg total) by mouth daily. 30 tablet 11  . cloNIDine (CATAPRES) 0.1 MG tablet Take 1 tablet (0.1 mg total) by mouth 2 (two) times daily. 60 tablet 11  . dicyclomine (BENTYL) 10 MG capsule 1-2 PO 30  MINUTES PRIOR TO MEALS AND AT BEDTIME TO TREAT DIARRHEA AND ABD  PAIN 60 capsule 1  . diltiazem (CARDIZEM CD) 180 MG 24 hr capsule TAKE ONE CAPSULE BY MOUTH DAILY 90 capsule 3  . FIBER COMPLETE PO Take 2 tablets by mouth 2 (two) times daily.      . furosemide (LASIX) 20 MG tablet Take 20 mg by mouth.    . gabapentin (NEURONTIN) 100 MG capsule Take 100 mg by mouth at bedtime.    . iron polysaccharides (NIFEREX) 150 MG capsule Take 150 mg by mouth daily.    . lansoprazole (PREVACID) 30 MG capsule Take 30 mg by mouth daily.      Marland Kitchen linagliptin (TRADJENTA) 5 MG TABS tablet Take 5 mg by mouth daily.    Marland Kitchen lisinopril (PRINIVIL,ZESTRIL) 2.5 MG tablet Take 2.5 mg by mouth daily.  4  . Multiple Vitamins-Minerals (CENTRUM SILVER PO) Take 1 tablet by mouth daily.      . Probiotic Product (PROBIOTIC DAILY PO) Take by mouth.    . psyllium (REGULOID) 0.52 G capsule Take 0.52 g by mouth 2 (two) times daily.    . raloxifene (EVISTA) 60 MG tablet Take 60 mg by mouth daily.      . traMADol (ULTRAM) 50 MG tablet Take 50 mg by mouth daily.      No current facility-administered medications for this visit.    Past Medical History  Diagnosis Date  . Barrett's esophagus 2005  2008: EGD/NUR-EROSIVE ESOPHAGITIS;  . Irritable bowel syndrome   . Diverticulitis     required colostomy-1993  . Hypertension   . Arteriosclerotic cardiovascular disease (ASCVD)     CABG surgery in 5/96; non-Q MI with normal EF in 04/2003; negative stress nuclear in 02/2009  . Anxiety   . Hyperlipidemia   . Osteoporosis   . Cerebrovascular disease     Bilat. bruits; no focal disease 2000-2004; 50-69% R+ L ICA stenosis in 6/09 +plaque  . Tobacco abuse, in remission     Quit in 1992  . Chronic kidney disease (CKD), stage III (moderate)     with hyperparathyroidism    Past Surgical History  Procedure Laterality Date  . Colostomy  1993    and reversal; ruptured diverticulum  . Colonoscopy  2005  . Coronary artery bypass graft  1996    x5  . Ventral hernia repair      following incarceration  with a small bowel obstruction  . Appendectomy    . Tonsillectomy    . Thyroidectomy, partial      Goiter  . Breast excisional biopsy      Benign mass  . Kidney surgery  Age 41  . Esophagogastroduodenoscopy  05/13/2007    Soft stricture at the GE junction with evidence of esophageal scarring and pseudodiverticular formation./  Soft stricture at GE junction which was dilated with a balloon up to 20 mm/  Moderate-sized sliding hiatal hernia.    Social History   Social History  . Marital Status: Widowed    Spouse Name: N/A  . Number of Children: N/A  . Years of Education: N/A   Occupational History  . [employer]     Retired   Social History Main Topics  . Smoking status: Former Smoker -- 1.00 packs/day for 30 years    Types: Cigarettes    Start date: 02/22/1952    Quit date: 09/03/1991  . Smokeless tobacco: Never Used     Comment: Quit smoking in 1991  . Alcohol Use: No  . Drug Use: No  . Sexual Activity: No   Other Topics Concern  . Not on file   Social History Narrative   Resides with son     Ceasar Mons Vitals:   09/09/15 1052  BP: 130/72  Pulse: 82  Height:  (1.651 m)  Weight: 151 lb 9.6 oz (68.765 kg)  SpO2: 90%    PHYSICAL EXAM General: NAD HEENT: Normal. Neck: No JVD, no thyromegaly. Lungs: Diminished sounds but clear with normal respiratory effort. CV: Nondisplaced PMI. Irregular rhythm, normal S1/S2, no S3, II/VI systolic murmur over right upper sternal border. No pretibial edema.  Abdomen: Soft, nontender, no distention.  Neurologic: Alert and oriented.  Psych: Normal affect. Skin: Normal. Musculoskeletal: No gross deformities. Extremities: No clubbing or cyanosis of upper extremities.  ECG: Most recent ECG reviewed.      ASSESSMENT AND PLAN: 1. CAD/CABG: Stable ischemic heart disease. Continue statin therapy. No aspirin as she is on anticoagulant therapy for atrial fibrillation.  2. Carotid artery stenosis: Moderate 50-69%  bilaterally on 02/26/2015. Will repeat in 2017. Continue statin therapy.  3. Hyperlipidemia: Lipids from 05/04/15 noted above. Continue high intensity statin therapy.  4. Type 2 diabetes: HbA1C 6.8%. Continue therapy with linagliptin. Managed by PCP.  5. CKD stage 3: Stable, creatinine 1.59 on 08/18/15.   6. Atrial fibrillation: The patient is entirely asymptomatic and heart rate is controlled on long-acting diltiazem 180 mg daily. CHADS-VASC score of 5, thus at high  risk for CVA. Will continue apixaban.  7. Essential hypertension: Controlled. No changes.  Dispo: f/u 6 months.   Prentice Docker, M.D., F.A.C.C.

## 2015-09-09 NOTE — Patient Instructions (Signed)

## 2015-09-22 ENCOUNTER — Other Ambulatory Visit: Payer: Self-pay | Admitting: Cardiovascular Disease

## 2015-11-24 ENCOUNTER — Encounter: Payer: Self-pay | Admitting: Gastroenterology

## 2015-12-23 ENCOUNTER — Other Ambulatory Visit: Payer: Self-pay | Admitting: Cardiovascular Disease

## 2016-01-17 ENCOUNTER — Other Ambulatory Visit: Payer: Self-pay | Admitting: Cardiovascular Disease

## 2016-02-09 ENCOUNTER — Other Ambulatory Visit: Payer: Self-pay | Admitting: Cardiovascular Disease

## 2016-03-06 ENCOUNTER — Other Ambulatory Visit (HOSPITAL_COMMUNITY): Payer: Self-pay | Admitting: Internal Medicine

## 2016-03-06 ENCOUNTER — Ambulatory Visit (HOSPITAL_COMMUNITY)
Admission: RE | Admit: 2016-03-06 | Discharge: 2016-03-06 | Disposition: A | Discharge status: Home / Self Care | Payer: Medicare Other | Source: Ambulatory Visit | Attending: Internal Medicine | Admitting: Internal Medicine

## 2016-03-06 DIAGNOSIS — R0602 Shortness of breath: Secondary | ICD-10-CM

## 2016-03-06 DIAGNOSIS — Z951 Presence of aortocoronary bypass graft: Secondary | ICD-10-CM | POA: Diagnosis not present

## 2016-03-06 DIAGNOSIS — R918 Other nonspecific abnormal finding of lung field: Secondary | ICD-10-CM | POA: Diagnosis not present

## 2016-03-20 ENCOUNTER — Encounter: Payer: Self-pay | Admitting: Cardiovascular Disease

## 2016-03-20 ENCOUNTER — Ambulatory Visit (INDEPENDENT_AMBULATORY_CARE_PROVIDER_SITE_OTHER): Payer: Medicare Other | Admitting: Cardiovascular Disease

## 2016-03-20 VITALS — BP 90/64 | HR 57 | Ht 65.0 in | Wt 145.0 lb

## 2016-03-20 DIAGNOSIS — I482 Chronic atrial fibrillation, unspecified: Secondary | ICD-10-CM

## 2016-03-20 DIAGNOSIS — Z951 Presence of aortocoronary bypass graft: Secondary | ICD-10-CM

## 2016-03-20 DIAGNOSIS — E785 Hyperlipidemia, unspecified: Secondary | ICD-10-CM

## 2016-03-20 DIAGNOSIS — I25768 Atherosclerosis of bypass graft of coronary artery of transplanted heart with other forms of angina pectoris: Secondary | ICD-10-CM | POA: Diagnosis not present

## 2016-03-20 DIAGNOSIS — I6523 Occlusion and stenosis of bilateral carotid arteries: Secondary | ICD-10-CM

## 2016-03-20 DIAGNOSIS — R0602 Shortness of breath: Secondary | ICD-10-CM | POA: Diagnosis not present

## 2016-03-20 DIAGNOSIS — I10 Essential (primary) hypertension: Secondary | ICD-10-CM

## 2016-03-20 NOTE — Progress Notes (Signed)
Patient ID: Victory Dakinlarice P Gann, female   DOB: 10/30/1928, 82 y.o.   MRN: [id number]      SUBJECTIVE: The patient presents for routine follow-up. She has a history of CABG in 1996, bilateral carotid artery stenosis, atrial fibrillation, anemia, hyperlipidemia, type 2 diabetes mellitus and chronic kidney disease stage III. She is not feeling well and has been battling a cough. She recently fell and hurt her left knee. She denies chest pain and palpitations. She also denies bleeding problems with Eliquis. She has chronic exertional dyspnea which has appeared to have gotten worse since her last visit. Last nuclear study did not show ischemia in 2010.   Review of Systems: As per "subjective", otherwise negative.  Allergies  Allergen Reactions  . Doxycycline Hyclate     REACTION: Nausea, epigastric pain  . Iohexol Rash    Premedication required   . Other Rash    Adhesive Tape    Current Outpatient Prescriptions  Medication Sig Dispense Refill  . acetaminophen (TYLENOL) 500 MG tablet Take 500 mg by mouth every 6 (six) hours as needed.      . ALPRAZolam (XANAX) 0.25 MG tablet Take 0.5 mg by mouth daily.     Marland Kitchen. atorvastatin (LIPITOR) 80 MG tablet TAKE ONE TABLET BY MOUTH DAILY. 30 tablet 11  . calcitRIOL (ROCALTROL) 0.25 MCG capsule Take 0.25 mcg by mouth daily.    . calcium carbonate (OS-CAL) 600 MG TABS Take 3,600 mg by mouth daily.      . chlorthalidone (HYGROTON) 25 MG tablet TAKE ONE TABLET BY MOUTH DAILY. 30 tablet 6  . cloNIDine (CATAPRES) 0.1 MG tablet TAKE ONE TABLET BY MOUTH TWICE DAILY. 60 tablet 6  . dicyclomine (BENTYL) 10 MG capsule 1-2 PO 30  MINUTES PRIOR TO MEALS AND AT BEDTIME TO TREAT DIARRHEA AND ABD PAIN 60 capsule 1  . diltiazem (CARDIZEM CD) 180 MG 24 hr capsule TAKE ONE CAPSULE BY MOUTH DAILY 90 capsule 3  . ELIQUIS 2.5 MG TABS tablet TAKE ONE TABLET BY MOUTH TWICE DAILY 60 tablet 6  . FIBER COMPLETE PO Take 2 tablets by mouth 2 (two) times daily.      . furosemide  (LASIX) 20 MG tablet Take 20 mg by mouth.    . gabapentin (NEURONTIN) 100 MG capsule Take 100 mg by mouth at bedtime.    . iron polysaccharides (NIFEREX) 150 MG capsule Take 150 mg by mouth daily.    . lansoprazole (PREVACID) 30 MG capsule Take 30 mg by mouth daily.      Marland Kitchen. linagliptin (TRADJENTA) 5 MG TABS tablet Take 5 mg by mouth daily.    Marland Kitchen. lisinopril (PRINIVIL,ZESTRIL) 2.5 MG tablet Take 2.5 mg by mouth daily.  4  . Multiple Vitamins-Minerals (CENTRUM SILVER PO) Take 1 tablet by mouth daily.      . Probiotic Product (PROBIOTIC DAILY PO) Take by mouth.    . psyllium (REGULOID) 0.52 G capsule Take 0.52 g by mouth 2 (two) times daily.    . raloxifene (EVISTA) 60 MG tablet Take 60 mg by mouth daily.      . traMADol (ULTRAM) 50 MG tablet Take 50 mg by mouth daily.      No current facility-administered medications for this visit.    Past Medical History  Diagnosis Date  . Barrett's esophagus 2005    2008: EGD/NUR-EROSIVE ESOPHAGITIS;  . Irritable bowel syndrome   . Diverticulitis     required colostomy-1993  . Hypertension   . Arteriosclerotic cardiovascular disease (ASCVD)  CABG surgery in 5/96; non-Q MI with normal EF in 04/2003; negative stress nuclear in 02/2009  . Anxiety   . Hyperlipidemia   . Osteoporosis   . Cerebrovascular disease     Bilat. bruits; no focal disease 2000-2004; 50-69% R+ L ICA stenosis in 6/09 +plaque  . Tobacco abuse, in remission     Quit in 1992  . Chronic kidney disease (CKD), stage III (moderate)     with hyperparathyroidism    Past Surgical History  Procedure Laterality Date  . Colostomy  1993    and reversal; ruptured diverticulum  . Colonoscopy  2005  . Coronary artery bypass graft  1996    x5  . Ventral hernia repair      following incarceration with a small bowel obstruction  . Appendectomy    . Tonsillectomy    . Thyroidectomy, partial      Goiter  . Breast excisional biopsy      Benign mass  . Kidney surgery  Age 82  .  Esophagogastroduodenoscopy  05/13/2007    Soft stricture at the GE junction with evidence of esophageal scarring and pseudodiverticular formation./  Soft stricture at GE junction which was dilated with a balloon up to 20 mm/  Moderate-sized sliding hiatal hernia.    Social History   Social History  . Marital Status: Widowed    Spouse Name: N/A  . Number of Children: N/A  . Years of Education: N/A   Occupational History  . [employer]     Retired   Social History Main Topics  . Smoking status: Former Smoker -- 1.00 packs/day for 30 years    Types: Cigarettes    Start date: 02/22/1952    Quit date: 09/03/1991  . Smokeless tobacco: Never Used     Comment: Quit smoking in 1991  . Alcohol Use: No  . Drug Use: No  . Sexual Activity: No   Other Topics Concern  . Not on file   Social History Narrative   Resides with son     Ceasar Mons Vitals:   03/20/16 0856  BP: 90/64  Pulse: 57  Height:  (1.651 m)  Weight: 145 lb (65.772 kg)  SpO2: 95%    PHYSICAL EXAM General: NAD HEENT: Normal. Neck: No JVD, no thyromegaly. Lungs: Diminished sounds but clear with normal respiratory effort. CV: Nondisplaced PMI. Irregular rhythm, normal S1/S2, no S3, II/VI systolic murmur over right upper sternal border. No pretibial edema.  Abdomen: Soft, nontender, no distention.  Neurologic: Alert and oriented.  Psych: Normal affect. Skin: Normal. Musculoskeletal: Left knee swelling.  ECG: Most recent ECG reviewed.      ASSESSMENT AND PLAN: 1. Exertional dyspnea in context of CAD/CABG: Will obtain a Lexiscan Cardiolite stress test. Continue statin therapy. No aspirin as she is on anticoagulant therapy for atrial fibrillation.  2. Carotid artery stenosis: Moderate 50-69% bilaterally on 02/26/2015. Will repeat. Continue statin therapy.  3. Hyperlipidemia: Continue high intensity statin therapy.  4. Atrial fibrillation: The patient is entirely asymptomatic and heart rate is controlled  on long-acting diltiazem 180 mg daily. CHADS-VASC score of 5, thus at high risk for CVA. Will continue apixaban.  5. Essential hypertension: Low normal. Will d/c chlorthalidone.  Dispo: f/u 2 months.   Prentice Docker, M.D., F.A.C.C.

## 2016-03-20 NOTE — Patient Instructions (Signed)
Your physician recommends that you schedule a follow-up appointment in:  2 months   STOP Chlorthalidone    Your physician has requested that you have a carotid duplex. This test is an ultrasound of the carotid arteries in your neck. It looks at blood flow through these arteries that supply the brain with blood. Allow one hour for this exam. There are no restrictions or special instructions.   Your physician has requested that you have a lexiscan myoview. For further information please visit [email or web address]. Please follow instruction sheet, as given.      Thank you for choosing [facility] !

## 2016-03-24 ENCOUNTER — Telehealth: Payer: Self-pay | Admitting: Cardiovascular Disease

## 2016-03-24 NOTE — Telephone Encounter (Signed)
pls let pt know what meds she can take prior to her test on Monday

## 2016-03-24 NOTE — Telephone Encounter (Signed)
Spoke to pt. She misunderstood directions.

## 2016-03-27 ENCOUNTER — Ambulatory Visit (HOSPITAL_COMMUNITY)
Admission: RE | Admit: 2016-03-27 | Discharge: 2016-03-27 | Disposition: A | Discharge status: Home / Self Care | Payer: Medicare Other | Source: Ambulatory Visit | Attending: Cardiovascular Disease | Admitting: Cardiovascular Disease

## 2016-03-27 ENCOUNTER — Encounter (HOSPITAL_COMMUNITY)
Admission: RE | Admit: 2016-03-27 | Discharge: 2016-03-27 | Disposition: A | Discharge status: Home / Self Care | Payer: Medicare Other | Source: Ambulatory Visit | Attending: Cardiovascular Disease | Admitting: Cardiovascular Disease

## 2016-03-27 ENCOUNTER — Encounter (HOSPITAL_COMMUNITY): Payer: Self-pay

## 2016-03-27 ENCOUNTER — Telehealth: Payer: Self-pay | Admitting: *Deleted

## 2016-03-27 DIAGNOSIS — Z951 Presence of aortocoronary bypass graft: Secondary | ICD-10-CM | POA: Diagnosis not present

## 2016-03-27 DIAGNOSIS — I6523 Occlusion and stenosis of bilateral carotid arteries: Secondary | ICD-10-CM | POA: Insufficient documentation

## 2016-03-27 DIAGNOSIS — R0602 Shortness of breath: Secondary | ICD-10-CM | POA: Diagnosis present

## 2016-03-27 DIAGNOSIS — I4891 Unspecified atrial fibrillation: Secondary | ICD-10-CM | POA: Diagnosis not present

## 2016-03-27 HISTORY — DX: Type 2 diabetes mellitus without complications: E11.9

## 2016-03-27 LAB — NM MYOCAR MULTI W/SPECT W/WALL MOTION / EF
CHL CUP NUCLEAR SDS: 4
CHL CUP NUCLEAR SRS: 1
CHL CUP NUCLEAR SSS: 5
LHR: 0.22
LV sys vol: 11 mL
LVDIAVOL: 57 mL (ref 46–106)
Peak HR: 90 {beats}/min
Rest HR: 68 {beats}/min
TID: 0.94

## 2016-03-27 MED ORDER — TECHNETIUM TC 99M SESTAMIBI - CARDIOLITE
10.0000 | Freq: Once | INTRAVENOUS | Status: AC | PRN
  Administered 2016-03-27: 09:00:00 10.8 via INTRAVENOUS

## 2016-03-27 MED ORDER — TECHNETIUM TC 99M SESTAMIBI GENERIC - CARDIOLITE
30.0000 | Freq: Once | INTRAVENOUS | Status: AC | PRN
  Administered 2016-03-27: 30.4 via INTRAVENOUS

## 2016-03-27 MED ORDER — REGADENOSON 0.4 MG/5ML IV SOLN
INTRAVENOUS | Status: AC
Start: 2016-03-27 — End: 2016-03-27
  Administered 2016-03-27: 0.4 mg
  Filled 2016-03-27: qty 5

## 2016-03-27 NOTE — Telephone Encounter (Signed)
Called patient with test results. No answer. Left message to call back.  

## 2016-03-27 NOTE — Telephone Encounter (Signed)
-----   Message from Laqueta LindenSuresh A Koneswaran, MD sent at 03/27/2016 11:37 AM EDT ----- Moderate bilateral disease. Repeat 1 year.

## 2016-03-30 ENCOUNTER — Encounter: Payer: Self-pay | Admitting: Cardiovascular Disease

## 2016-04-10 ENCOUNTER — Telehealth: Payer: Self-pay | Admitting: Cardiovascular Disease

## 2016-04-10 NOTE — Telephone Encounter (Signed)
Pt called stating that her feet and legs have started swelling since Dr. Kirtland BouchardK took her off one of her meds

## 2016-04-10 NOTE — Telephone Encounter (Signed)
Within 1 week of stopping chlorthalidone for low BP ,pt notes swelling in feet and legs.She just took BP at home and got  101/79 .Says she has used Antivert occasionally for dizziness.She has not weighes self, but weight today is 146 lbs

## 2016-04-11 NOTE — Telephone Encounter (Signed)
Called pt to let her know she can take an extra 20 mg of lasix on days where she has a 3 lb weight gain. She stated that she is still having swelling in her legs and they feel tight. She claimed she had lost 2 lbs last night but it did not make a difference in the size of her legs/ ankles.  She voiced understanding about taking an extra lasix when her weight is up 3  Lbs.

## 2016-04-11 NOTE — Telephone Encounter (Signed)
If she's taking Lasix 20 mg daily, can take an extra 20 mg for weight gain of 3 lbs within 24 hour period.

## 2016-05-17 ENCOUNTER — Encounter: Payer: Self-pay | Admitting: Cardiovascular Disease

## 2016-05-17 ENCOUNTER — Ambulatory Visit (INDEPENDENT_AMBULATORY_CARE_PROVIDER_SITE_OTHER): Payer: Medicare Other | Admitting: Cardiovascular Disease

## 2016-05-17 VITALS — BP 108/68 | HR 80 | Ht 66.0 in | Wt 143.0 lb

## 2016-05-17 DIAGNOSIS — I25768 Atherosclerosis of bypass graft of coronary artery of transplanted heart with other forms of angina pectoris: Secondary | ICD-10-CM | POA: Diagnosis not present

## 2016-05-17 DIAGNOSIS — I6523 Occlusion and stenosis of bilateral carotid arteries: Secondary | ICD-10-CM

## 2016-05-17 DIAGNOSIS — R600 Localized edema: Secondary | ICD-10-CM

## 2016-05-17 DIAGNOSIS — Z951 Presence of aortocoronary bypass graft: Secondary | ICD-10-CM | POA: Diagnosis not present

## 2016-05-17 DIAGNOSIS — I482 Chronic atrial fibrillation, unspecified: Secondary | ICD-10-CM

## 2016-05-17 DIAGNOSIS — I10 Essential (primary) hypertension: Secondary | ICD-10-CM | POA: Diagnosis not present

## 2016-05-17 DIAGNOSIS — E785 Hyperlipidemia, unspecified: Secondary | ICD-10-CM

## 2016-05-17 NOTE — Patient Instructions (Signed)
Medication Instructions:  STOP LISINOPRIL   Labwork: NONE  Testing/Procedures: NONE  Follow-Up: Your physician wants you to follow-up in: 6  MONTHS.  You will receive a reminder letter in the mail two months in advance. If you don't receive a letter, please call our office to schedule the follow-up appointment.   Any Other Special Instructions Will Be Listed Below (If Applicable).     If you need a refill on your cardiac medications before your next appointment, please call your pharmacy.

## 2016-05-17 NOTE — Progress Notes (Signed)
Patient ID: [name], female   DOB: 06/02/1928, 82 y.o.   MRN: [id number]      SUBJECTIVE: The patient is an 82 year old woman with a history of CABG in 1996, bilateral carotid artery stenosis, atrial fibrillation, anemia, hyperlipidemia, type 2 diabetes mellitus and chronic kidney disease stage III. She is feeling well and denies chest pain and palpitations. She also denies bleeding problems with Eliquis. She has chronic exertional dyspnea which has not gotten worse. She has bilateral leg edema and her nephrologist increased Lasix to 20 mg twice daily. Her saphenous vein was harvested from her right leg and she has more swelling in this leg. She had some vertigo last week which spontaneously resolved.   Review of Systems: As per "subjective", otherwise negative.  Allergies  Allergen Reactions  . Doxycycline Hyclate     REACTION: Nausea, epigastric pain  . Iohexol Rash    Premedication required   . Other Rash    Adhesive Tape    Current Outpatient Prescriptions  Medication Sig Dispense Refill  . acetaminophen (TYLENOL) 500 MG tablet Take 500 mg by mouth every 6 (six) hours as needed.      . ALPRAZolam (XANAX) 0.25 MG tablet Take 0.5 mg by mouth daily.     Marland Kitchen. atorvastatin (LIPITOR) 80 MG tablet TAKE ONE TABLET BY MOUTH DAILY. 30 tablet 11  . calcitRIOL (ROCALTROL) 0.25 MCG capsule Take 0.25 mcg by mouth daily.    . calcium carbonate (OS-CAL) 600 MG TABS Take 3,600 mg by mouth daily.      Marland Kitchen. dicyclomine (BENTYL) 10 MG capsule 1-2 PO 30  MINUTES PRIOR TO MEALS AND AT BEDTIME TO TREAT DIARRHEA AND ABD PAIN 60 capsule 1  . diltiazem (CARDIZEM CD) 180 MG 24 hr capsule TAKE ONE CAPSULE BY MOUTH DAILY 90 capsule 3  . ELIQUIS 2.5 MG TABS tablet TAKE ONE TABLET BY MOUTH TWICE DAILY 60 tablet 6  . FIBER COMPLETE PO Take 2 tablets by mouth 2 (two) times daily.      . furosemide (LASIX) 20 MG tablet Take 20 mg by mouth 2 (two) times daily.     Marland Kitchen. gabapentin (NEURONTIN) 100 MG capsule Take  100 mg by mouth at bedtime.    . iron polysaccharides (NIFEREX) 150 MG capsule Take 150 mg by mouth daily.    Marland Kitchen. linagliptin (TRADJENTA) 5 MG TABS tablet Take 5 mg by mouth daily.    Marland Kitchen. lisinopril (PRINIVIL,ZESTRIL) 2.5 MG tablet Take 2.5 mg by mouth daily.  4  . Multiple Vitamins-Minerals (CENTRUM SILVER PO) Take 1 tablet by mouth daily.      . pantoprazole (PROTONIX) 40 MG tablet Take 40 mg by mouth daily.  5  . Probiotic Product (PROBIOTIC DAILY PO) Take by mouth.    . psyllium (REGULOID) 0.52 G capsule Take 0.52 g by mouth 2 (two) times daily.    . raloxifene (EVISTA) 60 MG tablet Take 60 mg by mouth daily.      . traMADol (ULTRAM) 50 MG tablet Take 50 mg by mouth daily.      No current facility-administered medications for this visit.    Past Medical History  Diagnosis Date  . Barrett's esophagus 2005    2008: EGD/NUR-EROSIVE ESOPHAGITIS;  . Irritable bowel syndrome   . Diverticulitis     required colostomy-1993  . Hypertension   . Arteriosclerotic cardiovascular disease (ASCVD)     CABG surgery in 5/96; non-Q MI with normal EF in 04/2003; negative stress nuclear in 02/2009  .  Anxiety   . Hyperlipidemia   . Osteoporosis   . Cerebrovascular disease     Bilat. bruits; no focal disease 2000-2004; 50-69% R+ L ICA stenosis in 6/09 +plaque  . Tobacco abuse, in remission     Quit in 1992  . Chronic kidney disease (CKD), stage III (moderate)     with hyperparathyroidism  . Diabetes mellitus without complication [facility])     Past Surgical History  Procedure Laterality Date  . Colostomy  1993    and reversal; ruptured diverticulum  . Colonoscopy  2005  . Coronary artery bypass graft  1996    x5  . Ventral hernia repair      following incarceration with a small bowel obstruction  . Appendectomy    . Tonsillectomy    . Thyroidectomy, partial      Goiter  . Breast excisional biopsy      Benign mass  . Kidney surgery  Age 30  . Esophagogastroduodenoscopy  05/13/2007    Soft  stricture at the GE junction with evidence of esophageal scarring and pseudodiverticular formation./  Soft stricture at GE junction which was dilated with a balloon up to 20 mm/  Moderate-sized sliding hiatal hernia.    Social History   Social History  . Marital Status: Widowed    Spouse Name: N/A  . Number of Children: N/A  . Years of Education: N/A   Occupational History  . [employer]     Retired   Social History Main Topics  . Smoking status: Former Smoker -- 1.00 packs/day for 30 years    Types: Cigarettes    Start date: 02/22/1952    Quit date: 09/03/1991  . Smokeless tobacco: Never Used     Comment: Quit smoking in 1991  . Alcohol Use: No  . Drug Use: No  . Sexual Activity: No   Other Topics Concern  . Not on file   Social History Narrative   Resides with son     Ceasar Mons Vitals:   05/17/16 1009  BP: 108/68  Pulse: 80  Height:  (1.676 m)  Weight: 143 lb (64.864 kg)  SpO2: 93%    PHYSICAL EXAM General: NAD HEENT: Normal. Neck: No JVD, no thyromegaly. Lungs: Diminished sounds but clear with normal respiratory effort. CV: Nondisplaced PMI. Irregular rhythm, normal S1/S2, no S3, I/VI systolic murmur over right upper sternal border. Right pretibial edema trace to 1+, no left leg edema.  Abdomen: Soft, nontender, no distention.  Neurologic: Alert and oriented.  Psych: Normal affect. Skin: Right pretibial erythema. Musculoskeletal: No gross deformities. Extremities: No clubbing or cyanosis of upper extremities.   ECG: Most recent ECG reviewed.      ASSESSMENT AND PLAN: 1. CAD/CABG: Stable ischemic heart disease. Continue statin therapy. No aspirin as she is on anticoagulant therapy for atrial fibrillation.  2. Carotid artery stenosis: Moderate 50-69% bilaterally on 03/27/2016. Will repeat in one year. Continue statin therapy.  3. Hyperlipidemia: Continue high intensity statin therapy.  4. Atrial fibrillation: The patient is entirely  asymptomatic and heart rate is controlled on long-acting diltiazem 180 mg daily. CHADS-VASC score of 5, thus at high risk for CVA. Will continue apixaban.  5. Essential hypertension: Controlled. Given vertigo, will stop ACEI as I doubt it is providing any significant benefit.  6. Bilateral leg edema: Reasonably controlled on Lasix 20 mg bid. No changes.  Dispo: f/u 6 months.   Prentice Docker, M.D., F.A.C.C.

## 2016-05-18 ENCOUNTER — Encounter: Payer: Self-pay | Admitting: Gastroenterology

## 2016-05-25 ENCOUNTER — Ambulatory Visit: Payer: Self-pay | Admitting: Cardiovascular Disease

## 2016-06-20 ENCOUNTER — Observation Stay (HOSPITAL_COMMUNITY)
Admission: EM | Admit: 2016-06-20 | Discharge: 2016-06-21 | Disposition: A | Discharge status: Home / Self Care | Payer: Medicare Other | Attending: Internal Medicine | Admitting: Internal Medicine

## 2016-06-20 ENCOUNTER — Emergency Department (HOSPITAL_COMMUNITY): Payer: Medicare Other

## 2016-06-20 ENCOUNTER — Encounter (HOSPITAL_COMMUNITY): Payer: Self-pay | Admitting: *Deleted

## 2016-06-20 DIAGNOSIS — M25552 Pain in left hip: Secondary | ICD-10-CM | POA: Insufficient documentation

## 2016-06-20 DIAGNOSIS — I129 Hypertensive chronic kidney disease with stage 1 through stage 4 chronic kidney disease, or unspecified chronic kidney disease: Secondary | ICD-10-CM | POA: Diagnosis not present

## 2016-06-20 DIAGNOSIS — E119 Type 2 diabetes mellitus without complications: Secondary | ICD-10-CM | POA: Diagnosis not present

## 2016-06-20 DIAGNOSIS — Z79891 Long term (current) use of opiate analgesic: Secondary | ICD-10-CM | POA: Diagnosis not present

## 2016-06-20 DIAGNOSIS — S329XXA Fracture of unspecified parts of lumbosacral spine and pelvis, initial encounter for closed fracture: Secondary | ICD-10-CM | POA: Diagnosis present

## 2016-06-20 DIAGNOSIS — M25512 Pain in left shoulder: Secondary | ICD-10-CM | POA: Insufficient documentation

## 2016-06-20 DIAGNOSIS — I2510 Atherosclerotic heart disease of native coronary artery without angina pectoris: Secondary | ICD-10-CM | POA: Insufficient documentation

## 2016-06-20 DIAGNOSIS — I4891 Unspecified atrial fibrillation: Secondary | ICD-10-CM

## 2016-06-20 DIAGNOSIS — N184 Chronic kidney disease, stage 4 (severe): Secondary | ICD-10-CM

## 2016-06-20 DIAGNOSIS — I482 Chronic atrial fibrillation: Secondary | ICD-10-CM | POA: Diagnosis not present

## 2016-06-20 DIAGNOSIS — Y929 Unspecified place or not applicable: Secondary | ICD-10-CM | POA: Diagnosis not present

## 2016-06-20 DIAGNOSIS — R262 Difficulty in walking, not elsewhere classified: Secondary | ICD-10-CM | POA: Diagnosis not present

## 2016-06-20 DIAGNOSIS — Y999 Unspecified external cause status: Secondary | ICD-10-CM | POA: Diagnosis not present

## 2016-06-20 DIAGNOSIS — N183 Chronic kidney disease, stage 3 (moderate): Secondary | ICD-10-CM | POA: Insufficient documentation

## 2016-06-20 DIAGNOSIS — S32502A Unspecified fracture of left pubis, initial encounter for closed fracture: Secondary | ICD-10-CM | POA: Diagnosis not present

## 2016-06-20 DIAGNOSIS — S32810A Multiple fractures of pelvis with stable disruption of pelvic ring, initial encounter for closed fracture: Secondary | ICD-10-CM | POA: Diagnosis not present

## 2016-06-20 DIAGNOSIS — E785 Hyperlipidemia, unspecified: Secondary | ICD-10-CM | POA: Diagnosis not present

## 2016-06-20 DIAGNOSIS — W19XXXA Unspecified fall, initial encounter: Secondary | ICD-10-CM

## 2016-06-20 DIAGNOSIS — Y939 Activity, unspecified: Secondary | ICD-10-CM | POA: Insufficient documentation

## 2016-06-20 DIAGNOSIS — W1839XA Other fall on same level, initial encounter: Secondary | ICD-10-CM | POA: Insufficient documentation

## 2016-06-20 DIAGNOSIS — Z79899 Other long term (current) drug therapy: Secondary | ICD-10-CM | POA: Diagnosis not present

## 2016-06-20 DIAGNOSIS — S32592A Other specified fracture of left pubis, initial encounter for closed fracture: Secondary | ICD-10-CM

## 2016-06-20 DIAGNOSIS — S79912A Unspecified injury of left hip, initial encounter: Secondary | ICD-10-CM | POA: Diagnosis present

## 2016-06-20 HISTORY — DX: Unspecified atrial fibrillation: I48.91

## 2016-06-20 HISTORY — DX: Chronic kidney disease, stage 4 (severe): N18.4

## 2016-06-20 LAB — CBC WITH DIFFERENTIAL/PLATELET
BASOS ABS: 0 10*3/uL (ref 0.0–0.1)
Basophils Relative: 0 %
EOS ABS: 0 10*3/uL (ref 0.0–0.7)
EOS PCT: 0 %
HCT: 34.6 % — ABNORMAL LOW (ref 36.0–46.0)
Hemoglobin: 11 g/dL — ABNORMAL LOW (ref 12.0–15.0)
LYMPHS PCT: 5 %
Lymphs Abs: 0.9 10*3/uL (ref 0.7–4.0)
MCH: 30.2 pg (ref 26.0–34.0)
MCHC: 31.8 g/dL (ref 30.0–36.0)
MCV: 95.1 fL (ref 78.0–100.0)
Monocytes Absolute: 1 10*3/uL (ref 0.1–1.0)
Monocytes Relative: 6 %
NEUTROS PCT: 89 %
Neutro Abs: 13.8 10*3/uL — ABNORMAL HIGH (ref 1.7–7.7)
PLATELETS: 146 10*3/uL — AB (ref 150–400)
RBC: 3.64 MIL/uL — AB (ref 3.87–5.11)
RDW: 13.6 % (ref 11.5–15.5)
WBC: 15.7 10*3/uL — AB (ref 4.0–10.5)

## 2016-06-20 LAB — COMPREHENSIVE METABOLIC PANEL
ALT: 18 U/L (ref 14–54)
AST: 28 U/L (ref 15–41)
Albumin: 3.3 g/dL — ABNORMAL LOW (ref 3.5–5.0)
Alkaline Phosphatase: 46 U/L (ref 38–126)
Anion gap: 10 (ref 5–15)
BUN: 25 mg/dL — AB (ref 6–20)
CHLORIDE: 99 mmol/L — AB (ref 101–111)
CO2: 30 mmol/L (ref 22–32)
CREATININE: 1.65 mg/dL — AB (ref 0.44–1.00)
Calcium: 9.4 mg/dL (ref 8.9–10.3)
GFR calc Af Amer: 32 mL/min — ABNORMAL LOW (ref >60)
GFR calc non Af Amer: 27 mL/min — ABNORMAL LOW (ref >60)
GLUCOSE: 127 mg/dL — AB (ref 65–99)
Potassium: 3.4 mmol/L — ABNORMAL LOW (ref 3.5–5.1)
SODIUM: 139 mmol/L (ref 135–145)
Total Bilirubin: 0.7 mg/dL (ref 0.3–1.2)
Total Protein: 6.6 g/dL (ref 6.5–8.1)

## 2016-06-20 LAB — URINALYSIS, ROUTINE W REFLEX MICROSCOPIC
Bilirubin Urine: NEGATIVE
Glucose, UA: NEGATIVE mg/dL
KETONES UR: NEGATIVE mg/dL
Leukocytes, UA: NEGATIVE
NITRITE: NEGATIVE
PROTEIN: NEGATIVE mg/dL
Specific Gravity, Urine: 1.01 (ref 1.005–1.030)
pH: 7 (ref 5.0–8.0)

## 2016-06-20 LAB — URINE MICROSCOPIC-ADD ON
BACTERIA UA: NONE SEEN
SQUAMOUS EPITHELIAL / LPF: NONE SEEN
WBC, UA: NONE SEEN WBC/hpf (ref 0–5)

## 2016-06-20 MED ORDER — SODIUM CHLORIDE 0.9% FLUSH
3.0000 mL | Freq: Two times a day (BID) | INTRAVENOUS | Status: DC
  Administered 2016-06-20 – 2016-06-21 (×2): 3 mL via INTRAVENOUS

## 2016-06-20 MED ORDER — ONDANSETRON HCL 4 MG/2ML IJ SOLN: 4.0000 mg | Freq: Four times a day (QID) | INTRAMUSCULAR | Status: DC | PRN

## 2016-06-20 MED ORDER — RALOXIFENE HCL 60 MG PO TABS: 60.0000 mg | ORAL_TABLET | Freq: Every day | ORAL | Status: DC

## 2016-06-20 MED ORDER — ONDANSETRON HCL 4 MG PO TABS: 4.0000 mg | ORAL_TABLET | Freq: Four times a day (QID) | ORAL | Status: DC | PRN

## 2016-06-20 MED ORDER — HYDROCODONE-ACETAMINOPHEN 5-325 MG PO TABS
1.0000 | ORAL_TABLET | Freq: Four times a day (QID) | ORAL | Status: DC | PRN
Start: 2016-06-20 — End: 2016-06-21
  Administered 2016-06-20 – 2016-06-21 (×3): 1 via ORAL
  Filled 2016-06-20 (×3): qty 1

## 2016-06-20 MED ORDER — ACETAMINOPHEN 325 MG PO TABS: 650.0000 mg | ORAL_TABLET | Freq: Four times a day (QID) | ORAL | Status: DC | PRN

## 2016-06-20 MED ORDER — ACETAMINOPHEN 650 MG RE SUPP: 650.0000 mg | Freq: Four times a day (QID) | RECTAL | Status: DC | PRN

## 2016-06-20 MED ORDER — PANTOPRAZOLE SODIUM 40 MG PO TBEC
40.0000 mg | DELAYED_RELEASE_TABLET | Freq: Every day | ORAL | Status: DC
  Administered 2016-06-21: 40 mg via ORAL
  Filled 2016-06-20: qty 1

## 2016-06-20 MED ORDER — LINAGLIPTIN 5 MG PO TABS
5.0000 mg | ORAL_TABLET | Freq: Every day | ORAL | Status: DC
Start: 2016-06-21 — End: 2016-06-21
  Administered 2016-06-21: 5 mg via ORAL
  Filled 2016-06-20: qty 1

## 2016-06-20 MED ORDER — ONDANSETRON HCL 4 MG/2ML IJ SOLN
4.0000 mg | Freq: Once | INTRAMUSCULAR | Status: AC
  Administered 2016-06-20: 4 mg via INTRAVENOUS
  Filled 2016-06-20: qty 2

## 2016-06-20 MED ORDER — APIXABAN 2.5 MG PO TABS
2.5000 mg | ORAL_TABLET | Freq: Two times a day (BID) | ORAL | Status: DC
  Administered 2016-06-20 – 2016-06-21 (×2): 2.5 mg via ORAL
  Filled 2016-06-20 (×2): qty 1

## 2016-06-20 MED ORDER — MORPHINE SULFATE (PF) 2 MG/ML IV SOLN
2.0000 mg | Freq: Once | INTRAVENOUS | Status: AC
  Administered 2016-06-20: 2 mg via INTRAVENOUS
  Filled 2016-06-20: qty 1

## 2016-06-20 MED ORDER — GABAPENTIN 100 MG PO CAPS
100.0000 mg | ORAL_CAPSULE | Freq: Every day | ORAL | Status: DC
  Administered 2016-06-20: 100 mg via ORAL
  Filled 2016-06-20: qty 1

## 2016-06-20 MED ORDER — RALOXIFENE HCL 60 MG PO TABS
60.0000 mg | ORAL_TABLET | Freq: Every day | ORAL | Status: DC
  Administered 2016-06-20: 60 mg via ORAL
  Filled 2016-06-20: qty 1

## 2016-06-20 MED ORDER — SODIUM CHLORIDE 0.9% FLUSH: 3.0000 mL | INTRAVENOUS | Status: DC | PRN

## 2016-06-20 MED ORDER — TRAMADOL HCL 50 MG PO TABS
50.0000 mg | ORAL_TABLET | Freq: Four times a day (QID) | ORAL | Status: DC | PRN
Start: 2016-06-20 — End: 2016-06-21

## 2016-06-20 MED ORDER — FUROSEMIDE 40 MG PO TABS
40.0000 mg | ORAL_TABLET | Freq: Every day | ORAL | Status: DC
  Administered 2016-06-21: 40 mg via ORAL
  Filled 2016-06-20: qty 1

## 2016-06-20 MED ORDER — DILTIAZEM HCL ER COATED BEADS 180 MG PO CP24
180.0000 mg | ORAL_CAPSULE | Freq: Every day | ORAL | Status: DC
  Administered 2016-06-21: 180 mg via ORAL
  Filled 2016-06-20: qty 1

## 2016-06-20 MED ORDER — MORPHINE SULFATE (PF) 2 MG/ML IV SOLN
1.0000 mg | INTRAVENOUS | Status: DC | PRN
  Administered 2016-06-21: 1 mg via INTRAVENOUS
  Filled 2016-06-20: qty 1

## 2016-06-20 MED ORDER — SODIUM CHLORIDE 0.9 % IV SOLN: 250.0000 mL | INTRAVENOUS | Status: DC | PRN

## 2016-06-20 MED ORDER — ALPRAZOLAM 0.5 MG PO TABS
0.5000 mg | ORAL_TABLET | Freq: Every day | ORAL | Status: DC
  Administered 2016-06-20: 0.5 mg via ORAL
  Filled 2016-06-20: qty 1

## 2016-06-20 NOTE — H&P (Signed)
History and Physical  [name] ZOX:[id number] DOB: 18-Jul-1928 DOA: 06/20/2016  PCP: [name], MD  Patient coming from: home  Chief Complaint: fall with pelvic fracture  HPI:  82 yow with a hx of DM type 2, HLD, HTN, afib, and GERD presents with complaints of a fall. She reported no LOC or head injury and CXR was negative. Hip xray showed acute fracture of the left superior and inferior pubic ramus. She was unable to ambulate secondary to pain and referred for pain control.  She reports a nonmechanical fall this morning while watering her plants. She is normally fairly active. Denies any LOC or head injury. Landed on her shoulder. She was unable to walk due to pain. She denies any other issues. She does report previous falls. No recent cardiac symptoms or shortness of breath.  While in the ED, CMP showed an elevated Cr and BUN. Glucose was mildly elevated. CBC showed an elevated WBC. Left shoulder xray, and CXR showed no acute abnormalities. Hip xray showed left pelvic fracture. EDP discussed with orthopedics in [facility] who would see in consultation, however patient refused transfer.  In the emergency department: afebrile, VSS Imaging: CXR independently reviewed. NAD  Review of Systems: positive for intermittent, recurrent diarrhea (this is usual). Abd pain associated with her diarrhea.   Negative for fever, visual changes, sore throat, rash, chest pain, SOB, dysuria, bleeding.  Past Medical History  Diagnosis Date  . Barrett's esophagus 2005    2008: EGD/NUR-EROSIVE ESOPHAGITIS;  . Irritable bowel syndrome   . Diverticulitis     required colostomy-1993  . Hypertension   . Arteriosclerotic cardiovascular disease (ASCVD)     CABG surgery in 5/96; non-Q MI with normal EF in 04/2003; negative stress nuclear in 02/2009  . Anxiety   . Hyperlipidemia   . Osteoporosis   . Cerebrovascular disease     Bilat. bruits; no focal disease 2000-2004; 50-69% R+ L ICA stenosis in 6/09  +plaque  . Tobacco abuse, in remission     Quit in 1992  . Diabetes mellitus without complication (HCC)   . Atrial fibrillation (HCC)   . Chronic kidney disease (CKD), stage III (moderate)     with hyperparathyroidism  . Chronic kidney disease (CKD), stage IV (severe) (HCC) 06/20/2016    Past Surgical History  Procedure Laterality Date  . Colostomy  1993    and reversal; ruptured diverticulum  . Colonoscopy  2005  . Coronary artery bypass graft  1996    x5  . Ventral hernia repair      following incarceration with a small bowel obstruction  . Appendectomy    . Tonsillectomy    . Thyroidectomy, partial      Goiter  . Breast excisional biopsy      Benign mass  . Kidney surgery  Age 58  . Esophagogastroduodenoscopy  05/13/2007    Soft stricture at the GE junction with evidence of esophageal scarring and pseudodiverticular formation./  Soft stricture at GE junction which was dilated with a balloon up to 20 mm/  Moderate-sized sliding hiatal hernia.     reports that she quit smoking about 24 years ago. Her smoking use included Cigarettes. She started smoking about 64 years ago. She has a 30 pack-year smoking history. She has never used smokeless tobacco. She reports that she does not drink alcohol or use illicit drugs. Ambulatory status: ambulatory  Allergies  Allergen Reactions  . Doxycycline Hyclate     REACTION: Nausea, epigastric pain  . Contrast  Media [Iodinated Diagnostic Agents] Rash    Premedication required.  . Iohexol Rash    Premedication required   . Tape Rash    Adhesive tape    Family History  Problem Relation Age of Onset  . Coronary artery disease Father   . Heart failure Mother      Prior to Admission medications   Medication Sig Start Date End Date Taking? Authorizing Provider  acetaminophen (TYLENOL) 500 MG tablet Take 500 mg by mouth every 6 (six) hours as needed.     Yes Historical Provider, MD  ALPRAZolam Prudy Feeler) 0.25 MG tablet Take 0.5 mg by  mouth daily.    Yes Historical Provider, MD  atorvastatin (LIPITOR) 80 MG tablet TAKE ONE TABLET BY MOUTH DAILY. 02/09/16  Yes Laqueta Linden, MD  calcitRIOL (ROCALTROL) 0.25 MCG capsule Take 0.25 mcg by mouth every Monday, Wednesday, and Friday.    Yes Historical Provider, MD  calcium carbonate (OS-CAL) 600 MG TABS Take 3,600 mg by mouth daily.     Yes Historical Provider, MD  dicyclomine (BENTYL) 10 MG capsule 1-2 PO 30  MINUTES PRIOR TO MEALS AND AT BEDTIME TO TREAT DIARRHEA AND ABD PAIN Patient taking differently: 10-20 mg 4 (four) times daily as needed. TREAT DIARRHEA AND ABD PAIN 07/22/15  Yes Anice Paganini, NP  diltiazem (CARDIZEM CD) 180 MG 24 hr capsule TAKE ONE CAPSULE BY MOUTH DAILY 08/10/15  Yes Laqueta Linden, MD  ELIQUIS 2.5 MG TABS tablet TAKE ONE TABLET BY MOUTH TWICE DAILY 12/23/15  Yes Laqueta Linden, MD  FIBER COMPLETE PO Take 2 tablets by mouth 2 (two) times daily.     Yes Historical Provider, MD  furosemide (LASIX) 20 MG tablet Take 40 mg by mouth daily.    Yes Historical Provider, MD  gabapentin (NEURONTIN) 100 MG capsule Take 100 mg by mouth at bedtime.   Yes Historical Provider, MD  iron polysaccharides (NIFEREX) 150 MG capsule Take 150 mg by mouth daily.   Yes Historical Provider, MD  linagliptin (TRADJENTA) 5 MG TABS tablet Take 5 mg by mouth daily.   Yes Historical Provider, MD  Multiple Vitamins-Minerals (CENTRUM SILVER PO) Take 1 tablet by mouth daily.     Yes Historical Provider, MD  OVER THE COUNTER MEDICATION Apply 1 application topically daily as needed (as needed for skin irritation).   Yes Historical Provider, MD  pantoprazole (PROTONIX) 40 MG tablet Take 40 mg by mouth daily. 03/08/16  Yes Historical Provider, MD  Probiotic Product (PROBIOTIC DAILY PO) Take by mouth.   Yes Historical Provider, MD  raloxifene (EVISTA) 60 MG tablet Take 60 mg by mouth daily.     Yes Historical Provider, MD  traMADol (ULTRAM) 50 MG tablet Take 100 mg by mouth daily.    Yes  Historical Provider, MD    Physical Exam: Filed Vitals:   06/20/16 1132 06/20/16 1200 06/20/16 1309 06/20/16 1432  BP: 180/71 163/83 152/82 148/78  Pulse: 103 90 112 98  Temp:    98 F (36.7 C)  TempSrc:    Oral  Resp:   15 15  SpO2: 90% 92% 94% 95%   Constitutional:  . Appears calm and comfortable Eyes:  . PERRL and irises appear normal . Normal conjunctivae and lids ENMT:  . external ears, nose appear normal . grossly normal hearing, hard of hearing, deaf . Lips appear normal . Oropharynx: normal tongue Neck:  . neck appears normal, no masses, normal ROM, supple . no thyromegaly Respiratory:  .  CTA bilaterally, no w/r/r.  . Respiratory effort normal. No retractions or accessory muscle use Cardiovascular:  . RRR, no m/r/g . 2+ bilateral LE edema . Normal pedal pulses Abdomen:  . Abdomen appears normal; no tenderness or masses. nondistended . Ventral hernia nontender Musculoskeletal:  . RUE, RLE  o strength and tone normal o Normal ROM RUE  LUE/LLE exam limited by painBut but strength and tone appears normal. Skin:  . Ecchymosis over shoulder, bicep, and mid arm on left, forearm on the right.  Neurologic:  . Grossly normal Psychiatric:  . judgement and insight appear normal . Mental status o Mood, affect appropriate  Wt Readings from Last 3 Encounters:  05/17/16 64.864 kg (143 lb)  03/20/16 65.772 kg (145 lb)  09/09/15 68.765 kg (151 lb 9.6 oz)    I have personally reviewed following labs and imaging studies  Labs on Admission:  CBC:  Recent Labs Lab 06/20/16 1207  WBC 15.7*  NEUTROABS 13.8*  HGB 11.0*  HCT 34.6*  MCV 95.1  PLT 146*   Basic Metabolic Panel:  Recent Labs Lab 06/20/16 1207  NA 139  K 3.4*  CL 99*  CO2 30  GLUCOSE 127*  BUN 25*  CREATININE 1.65*  CALCIUM 9.4   Liver Function Tests:  Recent Labs Lab 06/20/16 1207  AST 28  ALT 18  ALKPHOS 46  BILITOT 0.7  PROT 6.6  ALBUMIN 3.3*   Urine analysis:      Component Value Date/Time   COLORURINE YELLOW 06/20/2016 1216   APPEARANCEUR CLEAR 06/20/2016 1216   LABSPEC 1.010 06/20/2016 1216   PHURINE 7.0 06/20/2016 1216   GLUCOSEU NEGATIVE 06/20/2016 1216   HGBUR TRACE* 06/20/2016 1216   HGBUR negative 05/07/2008 1009   BILIRUBINUR NEGATIVE 06/20/2016 1216   KETONESUR NEGATIVE 06/20/2016 1216   PROTEINUR NEGATIVE 06/20/2016 1216   UROBILINOGEN negative 05/07/2008 1009   NITRITE NEGATIVE 06/20/2016 1216   LEUKOCYTESUR NEGATIVE 06/20/2016 1216   Radiological Exams on Admission: Dg Chest 2 View  06/20/2016  CLINICAL DATA:  Status post fall today with onset of left chest pain. EXAM: CHEST  2 VIEW COMPARISON:  Patient lateral chest 03/06/2016 and 02/03/2009. FINDINGS: The patient is status post CABG. There is cardiomegaly without edema. Lungs are clear. No pneumothorax or pleural effusion. No focal bony abnormality. IMPRESSION: Cardiomegaly without acute disease. Electronically Signed   By: Cynthia Kanner M.D.   On: 06/20/2016 10:14   Dg Shoulder Left  06/20/2016  CLINICAL DATA:  Status post fall today with a left shoulder injury. Pain. Initial encounter. EXAM: LEFT SHOULDER - 2+ VIEW COMPARISON:  None. FINDINGS: No acute bony or joint abnormality is identified. The humeral head is mildly high-riding. Mild acromioclavicular degenerative change is seen. Imaged right lung is clear. IMPRESSION: No acute abnormality. Mildly high-riding humeral head is compatible with chronic rotator chronic tear. Electronically Signed   By: Cynthia Kanner M.D.   On: 06/20/2016 10:16   Dg Hip Unilat With Pelvis 2-3 Views Left  06/20/2016  CLINICAL DATA:  82 year old female fall.  Hip pain. EXAM: DG HIP (WITH OR WITHOUT PELVIS) 2-3V LEFT COMPARISON:  None. FINDINGS: Diffuse osteopenia. Acute fracture of the left superior and inferior pubic ramus, minimal displacement. Bilateral hips projects normally over the acetabula. Unremarkable appearance of the proximal left femur.  Left hip appears congruent. Extensive vascular calcifications. Bilateral hip degenerative changes. Degenerative changes of the lower lumbar spine. IMPRESSION: Acute fracture of the left superior and inferior pubic ramus, minimally displaced. No left hip fracture.  Atherosclerosis. Signed, Cynthia NeuJaime S. Loreta AveWagner, [name] [facility] Electronically Signed   By: Cynthia MorJaime  Wagner D.O.   On: 06/20/2016 10:14      Principal Problem:   Pelvic fracture (HCC) Active Problems:   Arteriosclerotic cardiovascular disease (ASCVD)   Chronic kidney disease (CKD), stage IV (severe) (HCC)   Atrial fibrillation (HCC)   Assessment/Plan 1. Acute fracture of the left superior and inferior pubic ramus, minimally displaced.  2. Atrial fibrillation. Chronically taking Eliquis as an outpatient. 3. ASCVD, CABG. 4. DM type 2. Blood sugars stable 5. CKD stage 4.  6. HLD.   Currently unable to walk secondary to pain.   Discussed ortho recommendations with pt.  [facility] and understands there is no ortho service here. This is reasonable given there is no operative managemet in this case. She understands that treatment is pain control.  Therapy evaluation   DVT prophylaxis:Eliquis Code Status: DNR Family Communication: discussed with patient. No family present Disposition Plan:obs to medical floor. Discharge home once improved   Consults called:    Admission status: obst    Time spent: 55 minutes  Cynthia Sacksaniel Jovana Rembold, MD  [facility] Direct contact: [phone] --Via amion app OR  --[email or web address]; password [email or web address]  7PM-7AM contact night coverage as above  06/20/2016, 3:07 PM  By signing my name below, I, [name], attest that this documentation has been prepared under the direction and in the presence of Cynthia Deyarmin P. Irene LimboGoodrich, MD. Electronically Signed: Adron BeneGreylon Silva, [name].  06/20/2016 12:40pm     I personally performed the  services described in this documentation. All medical record entries made by the [name] were at my direction. I have reviewed the chart and agree that the record reflects my personal performance and is accurate and complete. Cynthia Sacksaniel Donnesha Karg, MD

## 2016-06-20 NOTE — ED Notes (Signed)
Attempted to ambulate pt, pt unable to tolerate.  Placed back in bed by myself and tech.  PA made aware.

## 2016-06-20 NOTE — ED Notes (Signed)
Pt comes in by EMS for a fall. Pt states she believes her feet got tangled up. She is having pain in her left hip and left shoulder. Pt is alert and oriented.

## 2016-06-20 NOTE — ED Provider Notes (Signed)
CSN: [id number]     Arrival date & time 06/20/16  0856 History   First MD Initiated Contact with Patient 06/20/16 0900     Chief Complaint  Patient presents with  . Fall     (Consider location/radiation/quality/duration/timing/severity/associated sxs/prior Treatment) Patient is a 82 y.o. female presenting with fall. The history is provided by the patient. No language interpreter was used.  Fall This is a new problem. The current episode started today. The problem occurs constantly. The problem has been unchanged. Pertinent negatives include no neck pain. Nothing aggravates the symptoms. She has tried nothing for the symptoms. The treatment provided no relief.  Pt fell while watering her garden.  Pt complains of pain in her left hip and her left shoulder.  Pt unalbe to ambulate after fall  Past Medical History  Diagnosis Date  . Barrett's esophagus 2005    2008: EGD/NUR-EROSIVE ESOPHAGITIS;  . Irritable bowel syndrome   . Diverticulitis     required colostomy-1993  . Hypertension   . Arteriosclerotic cardiovascular disease (ASCVD)     CABG surgery in 5/96; non-Q MI with normal EF in 04/2003; negative stress nuclear in 02/2009  . Anxiety   . Hyperlipidemia   . Osteoporosis   . Cerebrovascular disease     Bilat. bruits; no focal disease 2000-2004; 50-69% R+ L ICA stenosis in 6/09 +plaque  . Tobacco abuse, in remission     Quit in 1992  . Chronic kidney disease (CKD), stage III (moderate)     with hyperparathyroidism  . Diabetes mellitus without complication [facility])    Past Surgical History  Procedure Laterality Date  . Colostomy  1993    and reversal; ruptured diverticulum  . Colonoscopy  2005  . Coronary artery bypass graft  1996    x5  . Ventral hernia repair      following incarceration with a small bowel obstruction  . Appendectomy    . Tonsillectomy    . Thyroidectomy, partial      Goiter  . Breast excisional biopsy      Benign mass  . Kidney surgery  Age 4  .  Esophagogastroduodenoscopy  05/13/2007    Soft stricture at the GE junction with evidence of esophageal scarring and pseudodiverticular formation./  Soft stricture at GE junction which was dilated with a balloon up to 20 mm/  Moderate-sized sliding hiatal hernia.   Family History  Problem Relation Age of Onset  . Coronary artery disease Father   . Heart failure Mother    Social History  Substance Use Topics  . Smoking status: Former Smoker -- 1.00 packs/day for 30 years    Types: Cigarettes    Start date: 02/22/1952    Quit date: 09/03/1991  . Smokeless tobacco: Never Used     Comment: Quit smoking in 1991  . Alcohol Use: No   OB History    No data available     Review of Systems  Musculoskeletal: Negative for neck pain.  All other systems reviewed and are negative.     Allergies  Doxycycline hyclate; Iohexol; and Other  Home Medications   Prior to Admission medications   Medication Sig Start Date End Date Taking? Authorizing Provider  acetaminophen (TYLENOL) 500 MG tablet Take 500 mg by mouth every 6 (six) hours as needed.      Historical Provider, MD  ALPRAZolam Prudy Feeler) 0.25 MG tablet Take 0.5 mg by mouth daily.     Historical Provider, MD  atorvastatin (LIPITOR) 80 MG tablet TAKE  ONE TABLET BY MOUTH DAILY. 02/09/16   Laqueta LindenSuresh A Koneswaran, MD  calcitRIOL (ROCALTROL) 0.25 MCG capsule Take 0.25 mcg by mouth daily.    Historical Provider, MD  calcium carbonate (OS-CAL) 600 MG TABS Take 3,600 mg by mouth daily.      Historical Provider, MD  dicyclomine (BENTYL) 10 MG capsule 1-2 PO 30  MINUTES PRIOR TO MEALS AND AT BEDTIME TO TREAT DIARRHEA AND ABD PAIN 07/22/15   Anice PaganiniEric A Gill, NP  diltiazem (CARDIZEM CD) 180 MG 24 hr capsule TAKE ONE CAPSULE BY MOUTH DAILY 08/10/15   Laqueta LindenSuresh A Koneswaran, MD  ELIQUIS 2.5 MG TABS tablet TAKE ONE TABLET BY MOUTH TWICE DAILY 12/23/15   Laqueta LindenSuresh A Koneswaran, MD  FIBER COMPLETE PO Take 2 tablets by mouth 2 (two) times daily.      Historical Provider, MD   furosemide (LASIX) 20 MG tablet Take 20 mg by mouth 2 (two) times daily.     Historical Provider, MD  gabapentin (NEURONTIN) 100 MG capsule Take 100 mg by mouth at bedtime.    Historical Provider, MD  iron polysaccharides (NIFEREX) 150 MG capsule Take 150 mg by mouth daily.    Historical Provider, MD  linagliptin (TRADJENTA) 5 MG TABS tablet Take 5 mg by mouth daily.    Historical Provider, MD  Multiple Vitamins-Minerals (CENTRUM SILVER PO) Take 1 tablet by mouth daily.      Historical Provider, MD  pantoprazole (PROTONIX) 40 MG tablet Take 40 mg by mouth daily. 03/08/16   Historical Provider, MD  Probiotic Product (PROBIOTIC DAILY PO) Take by mouth.    Historical Provider, MD  psyllium (REGULOID) 0.52 G capsule Take 0.52 g by mouth 2 (two) times daily.    Historical Provider, MD  raloxifene (EVISTA) 60 MG tablet Take 60 mg by mouth daily.      Historical Provider, MD  traMADol (ULTRAM) 50 MG tablet Take 50 mg by mouth daily.     Historical Provider, MD   BP 186/74 mmHg  Pulse 98  Temp(Src) 98.4 F (36.9 C) (Oral)  Resp 16  SpO2 99% Physical Exam  Constitutional: She is oriented to person, place, and time. She appears well-developed and well-nourished.  HENT:  Head: Normocephalic.  Eyes: EOM are normal.  Neck: Normal range of motion.  Pulmonary/Chest: Effort normal.  Abdominal: She exhibits no distension.  Neurological: She is alert and oriented to person, place, and time.  Psychiatric: She has a normal mood and affect.  Nursing note and vitals reviewed.   ED Course  Procedures (including critical care time) Labs Review Labs Reviewed - No data to display  Imaging Review Dg Chest 2 View  06/20/2016  CLINICAL DATA:  Status post fall today with onset of left chest pain. EXAM: CHEST  2 VIEW COMPARISON:  Patient lateral chest 03/06/2016 and 02/03/2009. FINDINGS: The patient is status post CABG. There is cardiomegaly without edema. Lungs are clear. No pneumothorax or pleural effusion.  No focal bony abnormality. IMPRESSION: Cardiomegaly without acute disease. Electronically Signed   By: Drusilla Kannerhomas  Dalessio M.D.   On: 06/20/2016 10:14   Dg Shoulder Left  06/20/2016  CLINICAL DATA:  Status post fall today with a left shoulder injury. Pain. Initial encounter. EXAM: LEFT SHOULDER - 2+ VIEW COMPARISON:  None. FINDINGS: No acute bony or joint abnormality is identified. The humeral head is mildly high-riding. Mild acromioclavicular degenerative change is seen. Imaged right lung is clear. IMPRESSION: No acute abnormality. Mildly high-riding humeral head is compatible with chronic rotator chronic tear.  Electronically Signed   By: Drusilla Kanner M.D.   On: 06/20/2016 10:16   Dg Hip Unilat With Pelvis 2-3 Views Left  06/20/2016  CLINICAL DATA:  82 year old female fall.  Hip pain. EXAM: DG HIP (WITH OR WITHOUT PELVIS) 2-3V LEFT COMPARISON:  None. FINDINGS: Diffuse osteopenia. Acute fracture of the left superior and inferior pubic ramus, minimal displacement. Bilateral hips projects normally over the acetabula. Unremarkable appearance of the proximal left femur. Left hip appears congruent. Extensive vascular calcifications. Bilateral hip degenerative changes. Degenerative changes of the lower lumbar spine. IMPRESSION: Acute fracture of the left superior and inferior pubic ramus, minimally displaced. No left hip fracture. Atherosclerosis. Signed, Yvone Neu. Loreta Ave, DO [facility] Electronically Signed   By: Gilmer Mor D.O.   On: 06/20/2016 10:14   I have personally reviewed and evaluated these images and lab results as part of my medical decision-making.   EKG Interpretation None      MDM Pt unalbe to ambulate.  I spoke to Dr. August Saucer who advised to have pt admit to medicine in [place].  He will consult   Final diagnoses:  Pubic ramus fracture, left, closed, initial encounter [facility])    I spoke to Dr opid who will admit.    Lonia Skinner [place], PA-C 06/20/16 1311  Lavera Guise, MD 06/21/16 1524

## 2016-06-21 DIAGNOSIS — S32502A Unspecified fracture of left pubis, initial encounter for closed fracture: Secondary | ICD-10-CM | POA: Diagnosis not present

## 2016-06-21 DIAGNOSIS — S32301A Unspecified fracture of right ilium, initial encounter for closed fracture: Secondary | ICD-10-CM

## 2016-06-21 NOTE — Progress Notes (Signed)
Discharge instructions read to patient and her family.  All verbalized understanding of instructions.Discharged to home with family 

## 2016-06-21 NOTE — Evaluation (Signed)
Physical Therapy Evaluation Patient Details Name: [name] MRN: [id number] DOB: 06/02/1928 Today's Date: 06/21/2016   History of Present Illness  82 yo F admitted 06/20/2016 s/p fall while watering her plants with pelvic fx. Hip XR shows acute fx of the L superior and inferior pubic ramus. Unable to ambulate due to pain. PMH: DM 2, HTN, Afib, GERD, barrett's syndrome, anxiety, osteoporosis, CKD stage IV, colostomy and reversal due to ruptured diverticulum, CABG x 5, ventral hernia repair, appendectomy, partial thyroidectomy, kidney surgery.   Clinical Impression  Pt received in bed, and was agreeable to PT evaluation.  Pt lives with her son, who is legally blind.  Pt states she ambulates with her cane all the time, and is independent with all other ADL's.  During today's evaluation, pt required Max A for supine<>sit transfer, and Min A for sit<>stand transfer with RW and stand pivot transfer from bed<>chair with RW and Min A.  Further gait distance was limited by pain from pelvic fx, as well as pain in L shoulder.  At this point, she is strongly recommended for SNF due to her high level of independence prior to admission and limited assistance at home.  Pt states that she wants to go home, and if she chooses to go home, she will need 24/7 supervision/assistance, HHPT, and a w/c.      Follow Up Recommendations SNF    Equipment Recommendations       Recommendations for Other Services       Precautions / Restrictions Precautions Precautions: Fall Restrictions Weight Bearing Restrictions: No      Mobility  Bed Mobility Overal bed mobility: Needs Assistance Bed Mobility: Supine to Sit     Supine to sit: Max assist;HOB elevated     General bed mobility comments: increased time.   Transfers Overall transfer level: Needs assistance Equipment used: Rolling walker (2 wheeled) Transfers: Sit to/from [employer] CorporationStand;Stand Pivot Transfers Sit to Stand: Min assist Stand pivot transfers:  Min assist       General transfer comment: Pt required 1-step cues for sequencing "RW, left foot, right foot" to progress forward and turn to sit in the chair.   Ambulation/Gait Ambulation/Gait assistance:  (NA due to increased pain with weight bearing. )              Stairs            Wheelchair Mobility    Modified Rankin (Stroke Patients Only)       Balance Overall balance assessment: Needs assistance Sitting-balance support: Bilateral upper extremity supported Sitting balance-Leahy Scale: Good     Standing balance support: Bilateral upper extremity supported Standing balance-Leahy Scale: Poor                               Pertinent Vitals/Pain Pain Assessment: 0-10 Pain Score: 5  Pain Location: L shoulder Pain Descriptors / Indicators: Constant;Aching Pain Intervention(s): Premedicated before session;Limited activity within patient's tolerance    Home Living   Living Arrangements: Children (son - legally blind) Available Help at Discharge: Family (niece - drives to the doctor's appointments) Type of Home: Mobile home Home Access: Ramped entrance     Home Layout: One level Home Equipment: Walker - 2 wheels;Cane - single point;Bedside commode;Shower seat      Prior Function     Gait / Transfers Assistance Needed: Pt states that she always uses a cane to walk  ADL's / Homemaking Assistance Needed: Pt  shares responsibilities for cooking/cleaning.  Calls nephew to do the "heavy" stuff.  Independent with dressing/bathing.         Hand Dominance   Dominant Hand: Right    Extremity/Trunk Assessment   Upper Extremity Assessment: Generalized weakness;LUE deficits/detail       LUE Deficits / Details: C/o pain in L shoulder with a chronic rotator cuff injury per XR.    Lower Extremity Assessment: Generalized weakness;RLE deficits/detail;LLE deficits/detail RLE Deficits / Details: Hip flexion 2/5, knee extension 2+/5, and ankle DF  3/5 LLE Deficits / Details: Hip flexion 2/5, knee extension 2+/5, and ankle DF 3/5     Communication   Communication: No difficulties  Cognition Arousal/Alertness: Awake/alert Behavior During Therapy: WFL for tasks assessed/performed Overall Cognitive Status: Within Functional Limits for tasks assessed                      General Comments      Exercises        Assessment/Plan    PT Assessment Patient needs continued PT services  PT Diagnosis Difficulty walking;Abnormality of gait;Generalized weakness;Acute pain   PT Problem List Decreased strength;Decreased range of motion;Decreased activity tolerance;Decreased balance;Decreased mobility;Decreased knowledge of use of DME;Decreased safety awareness;Decreased knowledge of precautions;Pain  PT Treatment Interventions DME instruction;Gait training;Functional mobility training;Therapeutic activities;Therapeutic exercise;Balance training;Patient/family education;Wheelchair mobility training   PT Goals (Current goals can be found in the Care Plan section) Acute Rehab PT Goals Patient Stated Goal: Pt states she wants to go home.  PT Goal Formulation: With patient Time For Goal Achievement: 06/28/16 Potential to Achieve Goals: Good    Frequency Min 4X/week   Barriers to discharge        Co-evaluation               End of Session Equipment Utilized During Treatment: Gait belt Activity Tolerance: Patient limited by pain Patient left: in chair;with call bell/phone within reach Nurse Communication: Mobility status    Functional Assessment Tool Used: [employer] "6-clicks"  Functional Limitation: Mobility: Walking and moving around Mobility: Walking and Moving Around Current Status [phone]): At least 60 percent but less than 80 percent impaired, limited or restricted Mobility: Walking and Moving Around Goal Status [phone]): At least 40 percent but less than 60 percent impaired, limited or restricted     Time: 0905-0935 PT Time Calculation (min) (ACUTE ONLY): 30 min   Charges:   PT Evaluation $PT Eval Moderate Complexity: 1 Procedure PT Treatments $Therapeutic Activity: 8-22 mins   PT G Codes:   PT G-Codes **NOT FOR INPATIENT CLASS** Functional Assessment Tool Used: [employer] "6-clicks"  Functional Limitation: Mobility: Walking and moving around Mobility: Walking and Moving Around Current Status [phone]): At least 60 percent but less than 80 percent impaired, limited or restricted Mobility: Walking and Moving Around Goal Status [phone]): At least 40 percent but less than 60 percent impaired, limited or restricted    Beth Timmya Blazier, PT, DPT X: [phone]

## 2016-06-21 NOTE — Care Management Obs Status (Signed)
MEDICARE OBSERVATION STATUS NOTIFICATION   Patient Details  Name: [name] MRN: [id number] Date of Birth: 11/22/1928   Medicare Observation Status Notification Given:  Yes    Ariam Mol, Chrystine OilerSharley Diane, RN 06/21/2016, 8:45 AM

## 2016-06-21 NOTE — Care Management Note (Addendum)
Case Management Note  Patient Details  Name: Victory DakinClarice P Smaltz MRN: [id number] Date of Birth: 06/19/1928  Subjective/Objective:  Patient admitted from home with pelvic fracture. Her legally blind son lives with her. She is ind with ADL's. She use a cane most of the time but also has a walker. Her niece drives her to appointments. Her PCP is Dr. Margo AyeHall. She reports no issues with obtaining medications. Patient reports [facility] in the past and if necessary would like to use [facility] again.                Action/Plan: Anticipate patient will need home health PT vs SNF. At discharge. Will Follow.    Expected Discharge Date:                 06/23/2016 Expected Discharge Plan:  Home w Home Health Services  In-House Referral:  Clinical Social Work  Discharge planning Services  CM Consult  Post Acute Care Choice:    Choice offered to:     DME Arranged:    DME Agency:     HH Arranged:    HH Agency:     Status of Service:  In process, will continue to follow  If discussed at Long Length of Stay Meetings, dates discussed:    Additional Comments:  Cyruss Arata, Chrystine OilerSharley Diane, RN 06/21/2016, 8:42 AM

## 2016-06-21 NOTE — Care Management Note (Signed)
Case Management Note  Patient Details  Name: Victory DakinClarice P Tippets MRN: [id number] Date of Birth: 11/21/1928  Expected Discharge Date:        06/21/2016          Expected Discharge Plan:  Home w Home Health Services  In-House Referral:  Clinical Social Work  Discharge planning Services  CM Consult  Post Acute Care Choice:  Durable Medical Equipment, Home Health Choice offered to:  Patient  DME Arranged:  3-N-1 DME Agency:  [facility]  [facility]  [facility]     Status of Service:  Completed, signed off  If discussed at Long Length of Stay Meetings, dates discussed:    Additional Comments: Patient discharging home with home health. E[facility] use [facility]. Al[facility]inda [facility]oth[facility] and will obtain information from chart. Wheelchair will be delivered to patient's room. Patient aware [facility] has 48 hours to make first visit.   Janesa Dockery, Chrystine OilerSharley Diane, RN 06/21/2016, 2:59 PM

## 2016-06-21 NOTE — Plan of Care (Signed)
Problem: Health Behavior/Discharge Planning: Goal: Ability to manage health-related needs will improve Outcome: Completed/Met Date Met:  06/21/16 Home health for rehab

## 2016-06-21 NOTE — Clinical Social Work Note (Signed)
Clinical Social Work Assessment  Patient Details  Name: [name] MRN: [id number] Date of Birth: 1928/09/16  Date of referral:  06/21/16               Reason for consult:  Discharge Planning                Permission sought to share information with:  Family Supports Permission granted to share information::  Yes, Verbal Permission Granted  Name::        Agency::     Relationship::  nephew in room  Contact Information:     Housing/Transportation Living arrangements for the past 2 months:  Single Family Home Source of Information:  Patient Patient Interpreter Needed:  None Criminal Activity/Legal Involvement Pertinent to Current Situation/Hospitalization:  No - Comment as needed Significant Relationships:  Adult Children Lives with:  Adult Children Do you feel safe going back to the place where you live?  Yes Need for family participation in patient care:  Yes (Comment)  Care giving concerns:  Pt is not at baseline.    Social Worker assessment / plan:  CSW met with pt at bedside. Pt alert and oriented and reports that she lives with her son who is legally blind. Pt's nephew in room and pt agrees to complete assessment with him present. Pt's son is at home around the clock. Pt in observation due to pelvic fracture. She is independent at baseline. PT evaluated pt and recommend either SNF or home health. CSW discussed placement process, including Medicare coverage/criteria. She immediately responded that she would be returning home. CM aware. CSW will sign off, but can be reconsulted if needed.   Employment status:  Retired Forensic scientist:  [employer] PT Recommendations:  [place], Home with [place] / Referral to community resources:  [place]  Patient/Family's Response to care:  Pt is adamant that she is returning home at d/c.   Patient/Family's Understanding of and Emotional Response to Diagnosis, Current Treatment, and  Prognosis:  Pt reports understanding of treatment plan and recommendations. She feels that she can manage at home and states, "When I get home, I will do better."  Emotional Assessment Appearance:  Appears stated age Attitude/Demeanor/Rapport:  Other (Cooperative) Affect (typically observed):  Appropriate Orientation:  Oriented to Self, Oriented to Place, Oriented to  Time, Oriented to Situation Alcohol / Substance use:  Not Applicable Psych involvement (Current and /or in the community):  No (Comment)  Discharge Needs  Concerns to be addressed:  Discharge Planning Concerns Readmission within the last 30 days:  No Current discharge risk:  Physical Impairment Barriers to Discharge:  Continued Medical Work up   [employer], [employer], [place] 06/21/2016, 10:17 AM [phone]

## 2016-06-21 NOTE — Discharge Summary (Signed)
Physician Discharge Summary  [name]:[id number] DOB: 03-01-1928 DOA: 06/20/2016  PCP: Cynthia Melena, MD  Admit date: 06/20/2016 Discharge date: 06/21/2016  Time spent: 45 minutes  Recommendations for Outpatient Follow-up:  -Will be discharged home today. -HH services will be arranged.   Discharge Diagnoses:  Principal Problem:   Pelvic fracture (HCC) Active Problems:   Arteriosclerotic cardiovascular disease (ASCVD)   Chronic kidney disease (CKD), stage IV (severe) (HCC)   Atrial fibrillation (HCC)   Discharge Condition: Stable  Filed Weights   06/20/16 1534  Weight: 66.225 kg (146 lb)    History of present illness:  As per Dr. Irene Limbo on 7/11: 82 yow with a hx of DM type 2, HLD, HTN, afib, and GERD presents with complaints of a fall. She reported no LOC or head injury and CXR was negative. Hip xray showed acute fracture of the left superior and inferior pubic ramus. She was unable to ambulate secondary to pain and referred for pain control.  She reports a nonmechanical fall this morning while watering her plants. She is normally fairly active. Denies any LOC or head injury. Landed on her shoulder. She was unable to walk due to pain. She denies any other issues. She does report previous falls. No recent cardiac symptoms or shortness of breath.  While in the ED, CMP showed an elevated Cr and BUN. Glucose was mildly elevated. CBC showed an elevated WBC. Left shoulder xray, and CXR showed no acute abnormalities. Hip xray showed left pelvic fracture. EDP discussed with orthopedics in [facility] who would see in consultation, however patient refused transfer.  Hospital Course:   Left Superior and Inferior Pubic Rami Fractures -Non-op management. -Pain is fairly well controlled. -Will DC home with [facility] services and supervision from niece; patient unable to pay out of pocket for SNF.  Rest of chronic conditions have been stable and home meds have not  changed.  Procedures:  None   Consultations:  None  Discharge Instructions  Discharge Instructions    Diet - low sodium heart healthy    Complete by:  As directed      Increase activity slowly    Complete by:  As directed             Medication List    TAKE these medications        acetaminophen 500 MG tablet  Commonly known as:  TYLENOL  Take 500 mg by mouth every 6 (six) hours as needed.     ALPRAZolam 0.25 MG tablet  Commonly known as:  XANAX  Take 0.5 mg by mouth daily.     atorvastatin 80 MG tablet  Commonly known as:  LIPITOR  TAKE ONE TABLET BY MOUTH DAILY.     calcitRIOL 0.25 MCG capsule  Commonly known as:  ROCALTROL  Take 0.25 mcg by mouth every Monday, Wednesday, and Friday.     calcium carbonate 600 MG Tabs tablet  Commonly known as:  OS-CAL  Take 3,600 mg by mouth daily.     CENTRUM SILVER PO  Take 1 tablet by mouth daily.     dicyclomine 10 MG capsule  Commonly known as:  BENTYL  1-2 PO 30  MINUTES PRIOR TO MEALS AND AT BEDTIME TO TREAT DIARRHEA AND ABD PAIN     diltiazem 180 MG 24 hr capsule  Commonly known as:  CARDIZEM CD  TAKE ONE CAPSULE BY MOUTH DAILY     ELIQUIS 2.5 MG Tabs tablet  Generic drug:  apixaban  TAKE ONE TABLET BY MOUTH TWICE DAILY     FIBER COMPLETE PO  Take 2 tablets by mouth 2 (two) times daily.     furosemide 20 MG tablet  Commonly known as:  LASIX  Take 40 mg by mouth daily.     gabapentin 100 MG capsule  Commonly known as:  NEURONTIN  Take 100 mg by mouth at bedtime.     iron polysaccharides 150 MG capsule  Commonly known as:  NIFEREX  Take 150 mg by mouth daily.     linagliptin 5 MG Tabs tablet  Commonly known as:  TRADJENTA  Take 5 mg by mouth daily.     pantoprazole 40 MG tablet  Commonly known as:  PROTONIX  Take 40 mg by mouth daily.     PROBIOTIC DAILY PO  Take by mouth.     raloxifene 60 MG tablet  Commonly known as:  EVISTA  Take 60 mg by mouth daily.     traMADol 50 MG tablet   Commonly known as:  ULTRAM  Take 100 mg by mouth daily.       Allergies  Allergen Reactions  . Doxycycline Hyclate     REACTION: Nausea, epigastric pain  . Contrast Media [Iodinated Diagnostic Agents] Rash    Premedication required.  . Iohexol Rash    Premedication required   . Tape Rash    Adhesive tape       Follow-up Information    Follow up with Cynthia Melena, MD. Schedule an appointment as soon as possible for a visit in 2 weeks.   Specialty:  Internal Medicine   Contact information:   [address] [place] [address] [phone]        The results of significant diagnostics from this hospitalization (including imaging, microbiology, ancillary and laboratory) are listed below for reference.    Significant Diagnostic Studies: Dg Chest 2 View  06/20/2016  CLINICAL DATA:  Status post fall today with onset of left chest pain. EXAM: CHEST  2 VIEW COMPARISON:  Patient lateral chest 03/06/2016 and 02/03/2009. FINDINGS: The patient is status post CABG. There is cardiomegaly without edema. Lungs are clear. No pneumothorax or pleural effusion. No focal bony abnormality. IMPRESSION: Cardiomegaly without acute disease. Electronically Signed   By: Drusilla Kanner M.D.   On: 06/20/2016 10:14   Dg Shoulder Left  06/20/2016  CLINICAL DATA:  Status post fall today with a left shoulder injury. Pain. Initial encounter. EXAM: LEFT SHOULDER - 2+ VIEW COMPARISON:  None. FINDINGS: No acute bony or joint abnormality is identified. The humeral head is mildly high-riding. Mild acromioclavicular degenerative change is seen. Imaged right lung is clear. IMPRESSION: No acute abnormality. Mildly high-riding humeral head is compatible with chronic rotator chronic tear. Electronically Signed   By: Drusilla Kanner M.D.   On: 06/20/2016 10:16   Dg Hip Unilat With Pelvis 2-3 Views Left  06/20/2016  CLINICAL DATA:  82 year old female fall.  Hip pain. EXAM: DG HIP (WITH OR WITHOUT PELVIS) 2-3V LEFT  COMPARISON:  None. FINDINGS: Diffuse osteopenia. Acute fracture of the left superior and inferior pubic ramus, minimal displacement. Bilateral hips projects normally over the acetabula. Unremarkable appearance of the proximal left femur. Left hip appears congruent. Extensive vascular calcifications. Bilateral hip degenerative changes. Degenerative changes of the lower lumbar spine. IMPRESSION: Acute fracture of the left superior and inferior pubic ramus, minimally displaced. No left hip fracture. Atherosclerosis. Signed, Yvone Neu. Loreta Ave, DO [facility] Electronically Signed  By: Gilmer MorJaime  Wagner D.O.   On: 06/20/2016 10:14    Microbiology: No results found for this or any previous visit (from the past 240 hour(s)).   Labs: Basic Metabolic Panel:  Recent Labs Lab 06/20/16 1207  NA 139  K 3.4*  CL 99*  CO2 30  GLUCOSE 127*  BUN 25*  CREATININE 1.65*  CALCIUM 9.4   Liver Function Tests:  Recent Labs Lab 06/20/16 1207  AST 28  ALT 18  ALKPHOS 46  BILITOT 0.7  PROT 6.6  ALBUMIN 3.3*   No results for input(s): LIPASE, AMYLASE in the last 168 hours. No results for input(s): AMMONIA in the last 168 hours. CBC:  Recent Labs Lab 06/20/16 1207  WBC 15.7*  NEUTROABS 13.8*  HGB 11.0*  HCT 34.6*  MCV 95.1  PLT 146*   Cardiac Enzymes: No results for input(s): CKTOTAL, CKMB, CKMBINDEX, TROPONINI in the last 168 hours. BNP: BNP (last 3 results) No results for input(s): BNP in the last 8760 hours.  ProBNP (last 3 results) No results for input(s): PROBNP in the last 8760 hours.  CBG: No results for input(s): GLUCAP in the last 168 hours.     SignedChaya Jan:  HERNANDEZ ACOSTA,ESTELA  [facility] Pager: [phone] 06/21/2016, 2:55 PM

## 2016-06-28 ENCOUNTER — Other Ambulatory Visit: Payer: Self-pay | Admitting: Cardiovascular Disease

## 2016-06-29 ENCOUNTER — Ambulatory Visit: Payer: Medicare Other | Admitting: Gastroenterology

## 2016-09-16 ENCOUNTER — Emergency Department (HOSPITAL_COMMUNITY)
Admission: EM | Admit: 2016-09-16 | Discharge: 2016-09-16 | Disposition: A | Discharge status: Home / Self Care | Payer: Medicare Other | Source: Home / Self Care | Attending: Emergency Medicine | Admitting: Emergency Medicine

## 2016-09-16 ENCOUNTER — Encounter (HOSPITAL_COMMUNITY): Payer: Self-pay | Admitting: Emergency Medicine

## 2016-09-16 DIAGNOSIS — Z79899 Other long term (current) drug therapy: Secondary | ICD-10-CM | POA: Insufficient documentation

## 2016-09-16 DIAGNOSIS — E119 Type 2 diabetes mellitus without complications: Secondary | ICD-10-CM | POA: Insufficient documentation

## 2016-09-16 DIAGNOSIS — I129 Hypertensive chronic kidney disease with stage 1 through stage 4 chronic kidney disease, or unspecified chronic kidney disease: Secondary | ICD-10-CM | POA: Insufficient documentation

## 2016-09-16 DIAGNOSIS — N184 Chronic kidney disease, stage 4 (severe): Secondary | ICD-10-CM | POA: Insufficient documentation

## 2016-09-16 DIAGNOSIS — Z87891 Personal history of nicotine dependence: Secondary | ICD-10-CM | POA: Insufficient documentation

## 2016-09-16 DIAGNOSIS — I480 Paroxysmal atrial fibrillation: Secondary | ICD-10-CM | POA: Diagnosis not present

## 2016-09-16 DIAGNOSIS — M436 Torticollis: Secondary | ICD-10-CM

## 2016-09-16 DIAGNOSIS — I130 Hypertensive heart and chronic kidney disease with heart failure and stage 1 through stage 4 chronic kidney disease, or unspecified chronic kidney disease: Secondary | ICD-10-CM | POA: Diagnosis not present

## 2016-09-16 MED ORDER — DIAZEPAM 2 MG PO TABS: 5.0000 mg | ORAL_TABLET | Freq: Every evening | ORAL | 0 refills | Status: AC | PRN

## 2016-09-16 MED ORDER — METHOCARBAMOL 500 MG PO TABS
1000.0000 mg | ORAL_TABLET | Freq: Once | ORAL | Status: AC
  Administered 2016-09-16: 1000 mg via ORAL
  Filled 2016-09-16: qty 2

## 2016-09-16 MED ORDER — DIAZEPAM 2 MG PO TABS
2.0000 mg | ORAL_TABLET | Freq: Once | ORAL | Status: AC
  Administered 2016-09-16: 2 mg via ORAL
  Filled 2016-09-16: qty 1

## 2016-09-16 MED ORDER — HYDROCODONE-ACETAMINOPHEN 5-325 MG PO TABS: 2.0000 | ORAL_TABLET | Freq: Three times a day (TID) | ORAL | 0 refills | Status: AC | PRN

## 2016-09-16 MED ORDER — HYDROCODONE-ACETAMINOPHEN 5-325 MG PO TABS
1.0000 | ORAL_TABLET | Freq: Once | ORAL | Status: AC
  Administered 2016-09-16: 1 via ORAL
  Filled 2016-09-16: qty 1

## 2016-09-16 NOTE — ED Provider Notes (Signed)
[facility]-EMERG[facility]Y DEPT Provider Note   CSN: [id number] Arrival date & time: 09/16/16  1326  By signing my name below, I, Majel HomerPeyton Lee, attest that this documentation has been prepared under the direction and in the presence of Rolland PorterMark Karesha Trzcinski, MD . Electronically Signed: Majel HomerPeyton Lee, Scribe. 09/16/2016. 1:39 PM.  History   Chief Complaint Chief Complaint  Patient presents with  . Shoulder Pain   The history is provided by the patient. No language interpreter was used.   HPI Comments: [name] is a 82 y.o. female who presents to the Emergency Department complaining of gradually worsening, constant, posterior neck pain that began yesterday afternoon. Pt reports her pain radiates into her bilateral shoulders. She states she has taken tylenol and tramadol with no relief. She denies pain in her arms, chest pain and back pain.   Past Medical History:  Diagnosis Date  . Anxiety   . Arteriosclerotic cardiovascular disease (ASCVD)    CABG surgery in 5/96; non-Q MI with normal EF in 04/2003; negative stress nuclear in 02/2009  . Atrial fibrillation (HCC)   . Barrett's esophagus 2005   2008: EGD/NUR-EROSIVE ESOPHAGITIS;  . Cerebrovascular disease    Bilat. bruits; no focal disease 2000-2004; 50-69% R+ L ICA stenosis in 6/09 +plaque  . Chronic kidney disease (CKD), stage III (moderate)    with hyperparathyroidism  . Chronic kidney disease (CKD), stage IV (severe) (HCC) 06/20/2016  . Diabetes mellitus without complication (HCC)   . Diverticulitis    required colostomy-1993  . Hyperlipidemia   . Hypertension   . Irritable bowel syndrome   . Osteoporosis   . Tobacco abuse, in remission    Quit in 1992    Patient Active Problem List   Diagnosis Date Noted  . Pelvic fracture (HCC) 06/20/2016  . Chronic kidney disease (CKD), stage IV (severe) (HCC) 06/20/2016  . Atrial fibrillation (HCC) 06/20/2016  . Anemia, normocytic normochromic 06/28/2012  . Hypertension   . Diverticulitis   .  Arteriosclerotic cardiovascular disease (ASCVD)   . Cerebrovascular disease   . RENAL DISEASE, CHRONIC, STAGE III 03/11/2008  . Irritable bowel syndrome 06/20/2007  . Barrett's esophagus 04/17/2007  . INSUFFICIENCY, VENOUS NOS 01/17/2007  . Gastroesophageal reflux 01/17/2007  . Hyperlipidemia 11/09/2006  . OSTEOPOROSIS 11/09/2006    Past Surgical History:  Procedure Laterality Date  . APPENDECTOMY    . BREAST EXCISIONAL BIOPSY     Benign mass  . COLONOSCOPY  2005  . COLOSTOMY  1993   and reversal; ruptured diverticulum  . CORONARY ARTERY BYPASS GRAFT  1996   x5  . ESOPHAGOGASTRODUODENOSCOPY  05/13/2007   Soft stricture at the GE junction with evidence of esophageal scarring and pseudodiverticular formation./  Soft stricture at GE junction which was dilated with a balloon up to 20 mm/  Moderate-sized sliding hiatal hernia.  Marland Kitchen. KIDNEY SURGERY  Age 82  . THYROIDECTOMY, PARTIAL     Goiter  . TONSILLECTOMY    . VENTRAL HERNIA REPAIR     following incarceration with a small bowel obstruction    OB History    No data available       Home Medications    Prior to Admission medications   Medication Sig Start Date End Date Taking? Authorizing Provider  acetaminophen (TYLENOL) 500 MG tablet Take 500 mg by mouth every 6 (six) hours as needed.     Yes Historical Provider, MD  ALPRAZolam Prudy Feeler(XANAX) 0.25 MG tablet Take 0.5 mg by mouth daily.    Yes Historical Provider, MD  atorvastatin (LIPITOR) 80 MG tablet TAKE ONE TABLET BY MOUTH DAILY. 02/09/16  Yes Laqueta Linden, MD  calcium carbonate (OS-CAL) 600 MG TABS Take 3,600 mg by mouth daily.     Yes Historical Provider, MD  dicyclomine (BENTYL) 10 MG capsule 1-2 PO 30  MINUTES PRIOR TO MEALS AND AT BEDTIME TO TREAT DIARRHEA AND ABD PAIN Patient taking differently: 10-20 mg 4 (four) times daily as needed. TREAT DIARRHEA AND ABD PAIN 07/22/15  Yes Anice Paganini, NP  diltiazem (CARDIZEM CD) 180 MG 24 hr capsule TAKE ONE CAPSULE BY MOUTH DAILY  06/29/16  Yes Laqueta Linden, MD  ELIQUIS 2.5 MG TABS tablet TAKE ONE TABLET BY MOUTH TWICE DAILY 06/29/16  Yes Laqueta Linden, MD  FIBER COMPLETE PO Take 2 tablets by mouth 2 (two) times daily.     Yes Historical Provider, MD  furosemide (LASIX) 20 MG tablet Take 40 mg by mouth daily.    Yes Historical Provider, MD  gabapentin (NEURONTIN) 100 MG capsule Take 100 mg by mouth at bedtime.   Yes Historical Provider, MD  iron polysaccharides (NIFEREX) 150 MG capsule Take 150 mg by mouth daily.   Yes Historical Provider, MD  linagliptin (TRADJENTA) 5 MG TABS tablet Take 5 mg by mouth daily.   Yes Historical Provider, MD  Multiple Vitamins-Minerals (CENTRUM SILVER PO) Take 1 tablet by mouth daily.     Yes Historical Provider, MD  pantoprazole (PROTONIX) 40 MG tablet Take 40 mg by mouth daily. 03/08/16  Yes Historical Provider, MD  Probiotic Product (PROBIOTIC DAILY PO) Take by mouth.   Yes Historical Provider, MD  raloxifene (EVISTA) 60 MG tablet Take 60 mg by mouth daily.     Yes Historical Provider, MD  traMADol (ULTRAM) 50 MG tablet Take 100 mg by mouth daily.    Yes Historical Provider, MD  diazepam (VALIUM) 2 MG tablet Take 2.5 tablets (5 mg total) by mouth at bedtime as needed for muscle spasms. 09/16/16   Rolland Porter, MD  HYDROcodone-acetaminophen (NORCO/VICODIN) 5-325 MG tablet Take 2 tablets by mouth every 8 (eight) hours as needed. 09/16/16   Rolland Porter, MD    Family History Family History  Problem Relation Age of Onset  . Coronary artery disease Father   . Heart failure Mother     Social History Social History  Substance Use Topics  . Smoking status: Former Smoker    Packs/day: 1.00    Years: 30.00    Types: Cigarettes    Start date: 02/22/1952    Quit date: 09/03/1991  . Smokeless tobacco: Never Used     Comment: Quit smoking in 1991  . Alcohol use No     Allergies   Doxycycline hyclate; Contrast media [iodinated diagnostic agents]; Iohexol; and Tape   Review of  Systems Review of Systems  Constitutional: Negative for appetite change, chills, diaphoresis, fatigue and fever.  HENT: Negative for mouth sores, sore throat and trouble swallowing.   Eyes: Negative for visual disturbance.  Respiratory: Negative for cough, chest tightness, shortness of breath and wheezing.   Cardiovascular: Negative for chest pain.  Gastrointestinal: Negative for abdominal distention, abdominal pain, diarrhea, nausea and vomiting.  Endocrine: Negative for polydipsia, polyphagia and polyuria.  Genitourinary: Negative for dysuria, frequency and hematuria.  Musculoskeletal: Positive for arthralgias and neck pain. Negative for gait problem.  Skin: Negative for color change, pallor and rash.  Neurological: Negative for dizziness, syncope, light-headedness and headaches.  Hematological: Does not bruise/bleed easily.  Psychiatric/Behavioral: Negative for behavioral  problems and confusion.   Physical Exam Updated Vital Signs BP 99/69 (BP Location: Right Arm)   Pulse 92   Temp 98.5 F (36.9 C) (Oral)   Resp 18   Ht 5\' 6"  (1.676 m)   Wt 145 lb (65.8 kg)   SpO2 99%   BMI 23.40 kg/m   Physical Exam  Constitutional: She is oriented to person, place, and time. She appears well-developed and well-nourished. No distress.  HENT:  Head: Normocephalic.  Eyes: Conjunctivae are normal. Pupils are equal, round, and reactive to light. No scleral icterus.  Neck: Normal range of motion. Neck supple. No thyromegaly present.  Cardiovascular: Normal rate and regular rhythm.  Exam reveals no gallop and no friction rub.   No murmur heard. Pulmonary/Chest: Effort normal and breath sounds normal. No respiratory distress. She has no wheezes. She has no rales.  Abdominal: Soft. Bowel sounds are normal. She exhibits no distension. There is no tenderness. There is no rebound.  Musculoskeletal: Normal range of motion. She exhibits tenderness.  Midline and paracervical tenderness diffusely    Palpable paraspinal muscle tenderness   Neurological: She is alert and oriented to person, place, and time.  Skin: Skin is warm and dry. No rash noted.  Psychiatric: She has a normal mood and affect. Her behavior is normal.   ED Treatments / Results  Labs (all labs ordered are listed, but only abnormal results are displayed) Labs Reviewed - No data to display  EKG  EKG Interpretation None       Radiology No results found.  Procedures Procedures (including critical care time)  Medications Ordered in ED Medications  HYDROcodone-acetaminophen (NORCO/VICODIN) 5-325 MG per tablet 1 tablet (1 tablet Oral Given 09/16/16 1349)  methocarbamol (ROBAXIN) tablet 1,000 mg (1,000 mg Oral Given 09/16/16 1349)  diazepam (VALIUM) tablet 2 mg (2 mg Oral Given 09/16/16 1349)    DIAGNOSTIC STUDIES:  Oxygen Saturation is 99% on RA, normal by my interpretation.    COORDINATION OF CARE:  1:38 PM Discussed treatment plan with pt at bedside and pt agreed to plan.  Initial Impression / Assessment and Plan / ED Course  I have reviewed the triage vital signs and the nursing notes.  Pertinent labs & imaging results that were available during my care of the patient were reviewed by me and considered in my medical decision making (see chart for details).  Clinical Course    Feeling much improved after medications. Is not overly sedate. Awake alert conversant. Plan will be home. We'll stop her tramadol and her Xanax. 2 mg diamond night for spasm. 1/2-1 tablet Vicodin during the day for pain. Her niece will stay with her tonight.  I personally performed the services described in this documentation, which was scribed in my presence. The recorded information has been reviewed and is accurate.   Final Clinical Impressions(s) / ED Diagnoses   Final diagnoses:  Torticollis    New Prescriptions New Prescriptions   DIAZEPAM (VALIUM) 2 MG TABLET    Take 2.5 tablets (5 mg total) by mouth at bedtime as  needed for muscle spasms.   HYDROCODONE-ACETAMINOPHEN (NORCO/VICODIN) 5-325 MG TABLET    Take 2 tablets by mouth every 8 (eight) hours as needed.     Rolland Porter, MD 09/16/16 [phone]

## 2016-09-16 NOTE — Discharge Instructions (Signed)
Take Valium as needed for muscle spasm at night..  Vicodin 1/2-1 tablet as needed for pain.  Do not take Xanax, or tramadol.

## 2016-09-16 NOTE — ED Triage Notes (Signed)
Pt reports bilateral shoulder pain that radiates to neck since yesterday. Pt reports otc medication and tramadol had no effect. Pt alert and oriented. cbg en route 205. Pt denies cp, back pain, shortness of breath. nad noted.

## 2016-09-17 ENCOUNTER — Inpatient Hospital Stay (HOSPITAL_COMMUNITY): Payer: Medicare Other

## 2016-09-17 ENCOUNTER — Emergency Department (HOSPITAL_COMMUNITY): Payer: Medicare Other

## 2016-09-17 ENCOUNTER — Encounter (HOSPITAL_COMMUNITY): Payer: Self-pay | Admitting: *Deleted

## 2016-09-17 ENCOUNTER — Inpatient Hospital Stay (HOSPITAL_COMMUNITY)
Admission: EM | Admit: 2016-09-17 | Discharge: 2016-10-11 | DRG: 291 | Disposition: E | Payer: Medicare Other | Attending: Family Medicine | Admitting: Family Medicine

## 2016-09-17 DIAGNOSIS — N184 Chronic kidney disease, stage 4 (severe): Secondary | ICD-10-CM | POA: Diagnosis present

## 2016-09-17 DIAGNOSIS — I2510 Atherosclerotic heart disease of native coronary artery without angina pectoris: Secondary | ICD-10-CM | POA: Diagnosis present

## 2016-09-17 DIAGNOSIS — E1122 Type 2 diabetes mellitus with diabetic chronic kidney disease: Secondary | ICD-10-CM | POA: Diagnosis present

## 2016-09-17 DIAGNOSIS — D696 Thrombocytopenia, unspecified: Secondary | ICD-10-CM | POA: Diagnosis present

## 2016-09-17 DIAGNOSIS — X58XXXA Exposure to other specified factors, initial encounter: Secondary | ICD-10-CM | POA: Diagnosis present

## 2016-09-17 DIAGNOSIS — I4891 Unspecified atrial fibrillation: Secondary | ICD-10-CM | POA: Diagnosis present

## 2016-09-17 DIAGNOSIS — Z66 Do not resuscitate: Secondary | ICD-10-CM | POA: Diagnosis not present

## 2016-09-17 DIAGNOSIS — E876 Hypokalemia: Secondary | ICD-10-CM | POA: Diagnosis present

## 2016-09-17 DIAGNOSIS — I10 Essential (primary) hypertension: Secondary | ICD-10-CM | POA: Diagnosis present

## 2016-09-17 DIAGNOSIS — Z951 Presence of aortocoronary bypass graft: Secondary | ICD-10-CM

## 2016-09-17 DIAGNOSIS — I130 Hypertensive heart and chronic kidney disease with heart failure and stage 1 through stage 4 chronic kidney disease, or unspecified chronic kidney disease: Secondary | ICD-10-CM | POA: Diagnosis present

## 2016-09-17 DIAGNOSIS — M436 Torticollis: Secondary | ICD-10-CM | POA: Diagnosis present

## 2016-09-17 DIAGNOSIS — E785 Hyperlipidemia, unspecified: Secondary | ICD-10-CM | POA: Diagnosis present

## 2016-09-17 DIAGNOSIS — Z8249 Family history of ischemic heart disease and other diseases of the circulatory system: Secondary | ICD-10-CM

## 2016-09-17 DIAGNOSIS — I482 Chronic atrial fibrillation: Secondary | ICD-10-CM | POA: Diagnosis present

## 2016-09-17 DIAGNOSIS — I252 Old myocardial infarction: Secondary | ICD-10-CM

## 2016-09-17 DIAGNOSIS — I462 Cardiac arrest due to underlying cardiac condition: Secondary | ICD-10-CM | POA: Diagnosis not present

## 2016-09-17 DIAGNOSIS — I6523 Occlusion and stenosis of bilateral carotid arteries: Secondary | ICD-10-CM | POA: Diagnosis present

## 2016-09-17 DIAGNOSIS — I509 Heart failure, unspecified: Secondary | ICD-10-CM | POA: Insufficient documentation

## 2016-09-17 DIAGNOSIS — T17918A Gastric contents in respiratory tract, part unspecified causing other injury, initial encounter: Secondary | ICD-10-CM | POA: Diagnosis present

## 2016-09-17 DIAGNOSIS — E890 Postprocedural hypothyroidism: Secondary | ICD-10-CM | POA: Diagnosis present

## 2016-09-17 DIAGNOSIS — E119 Type 2 diabetes mellitus without complications: Secondary | ICD-10-CM

## 2016-09-17 DIAGNOSIS — Z79810 Long term (current) use of selective estrogen receptor modulators (SERMs): Secondary | ICD-10-CM

## 2016-09-17 DIAGNOSIS — Z7901 Long term (current) use of anticoagulants: Secondary | ICD-10-CM

## 2016-09-17 DIAGNOSIS — Z87891 Personal history of nicotine dependence: Secondary | ICD-10-CM

## 2016-09-17 DIAGNOSIS — E872 Acidosis: Secondary | ICD-10-CM | POA: Diagnosis not present

## 2016-09-17 DIAGNOSIS — Z79891 Long term (current) use of opiate analgesic: Secondary | ICD-10-CM

## 2016-09-17 DIAGNOSIS — Z7984 Long term (current) use of oral hypoglycemic drugs: Secondary | ICD-10-CM

## 2016-09-17 DIAGNOSIS — E871 Hypo-osmolality and hyponatremia: Secondary | ICD-10-CM | POA: Diagnosis present

## 2016-09-17 DIAGNOSIS — I5033 Acute on chronic diastolic (congestive) heart failure: Secondary | ICD-10-CM | POA: Diagnosis present

## 2016-09-17 DIAGNOSIS — Z8719 Personal history of other diseases of the digestive system: Secondary | ICD-10-CM | POA: Diagnosis not present

## 2016-09-17 DIAGNOSIS — N179 Acute kidney failure, unspecified: Secondary | ICD-10-CM | POA: Diagnosis present

## 2016-09-17 DIAGNOSIS — Z515 Encounter for palliative care: Secondary | ICD-10-CM | POA: Diagnosis not present

## 2016-09-17 DIAGNOSIS — I480 Paroxysmal atrial fibrillation: Secondary | ICD-10-CM | POA: Diagnosis present

## 2016-09-17 LAB — CBC WITH DIFFERENTIAL/PLATELET
BASOS ABS: 0 10*3/uL (ref 0.0–0.1)
BLASTS: 0 %
Band Neutrophils: 10 %
Basophils Relative: 0 %
EOS ABS: 0 10*3/uL (ref 0.0–0.7)
Eosinophils Relative: 0 %
HEMATOCRIT: 38.7 % (ref 36.0–46.0)
Hemoglobin: 12.2 g/dL (ref 12.0–15.0)
LYMPHS PCT: 11 %
Lymphs Abs: 1 10*3/uL (ref 0.7–4.0)
MCH: 29.3 pg (ref 26.0–34.0)
MCHC: 31.5 g/dL (ref 30.0–36.0)
MCV: 92.8 fL (ref 78.0–100.0)
MONOS PCT: 12 %
Metamyelocytes Relative: 0 %
Monocytes Absolute: 1.1 10*3/uL — ABNORMAL HIGH (ref 0.1–1.0)
Myelocytes: 0 %
NEUTROS ABS: 7.4 10*3/uL (ref 1.7–7.7)
Neutrophils Relative %: 67 %
OTHER: 0 %
PLATELETS: 93 10*3/uL — AB (ref 150–400)
PROMYELOCYTES ABS: 0 %
RBC: 4.17 MIL/uL (ref 3.87–5.11)
RDW: 14.6 % (ref 11.5–15.5)
WBC: 9.5 10*3/uL (ref 4.0–10.5)
nRBC: 0 /100 WBC

## 2016-09-17 LAB — URINE MICROSCOPIC-ADD ON

## 2016-09-17 LAB — URINALYSIS, ROUTINE W REFLEX MICROSCOPIC
Bilirubin Urine: NEGATIVE
GLUCOSE, UA: NEGATIVE mg/dL
Ketones, ur: NEGATIVE mg/dL
LEUKOCYTES UA: NEGATIVE
Nitrite: NEGATIVE
PH: 5.5 (ref 5.0–8.0)
Protein, ur: 100 mg/dL — AB
Specific Gravity, Urine: 1.01 (ref 1.005–1.030)

## 2016-09-17 LAB — I-STAT CHEM 8, ED
BUN: 47 mg/dL — ABNORMAL HIGH (ref 6–20)
CALCIUM ION: 1.31 mmol/L (ref 1.15–1.40)
CHLORIDE: 92 mmol/L — AB (ref 101–111)
Creatinine, Ser: 2.7 mg/dL — ABNORMAL HIGH (ref 0.44–1.00)
Glucose, Bld: 172 mg/dL — ABNORMAL HIGH (ref 65–99)
HCT: 41 % (ref 36.0–46.0)
HEMOGLOBIN: 13.9 g/dL (ref 12.0–15.0)
Potassium: 3.8 mmol/L (ref 3.5–5.1)
SODIUM: 131 mmol/L — AB (ref 135–145)
TCO2: 22 mmol/L (ref 0–100)

## 2016-09-17 LAB — BASIC METABOLIC PANEL
ANION GAP: 11 (ref 5–15)
BUN: 47 mg/dL — AB (ref 6–20)
CO2: 29 mmol/L (ref 22–32)
Calcium: 9.6 mg/dL (ref 8.9–10.3)
Chloride: 93 mmol/L — ABNORMAL LOW (ref 101–111)
Creatinine, Ser: 2.34 mg/dL — ABNORMAL HIGH (ref 0.44–1.00)
GFR calc Af Amer: 21 mL/min — ABNORMAL LOW (ref >60)
GFR, EST NON AFRICAN AMERICAN: 18 mL/min — AB (ref >60)
GLUCOSE: 111 mg/dL — AB (ref 65–99)
POTASSIUM: 3.2 mmol/L — AB (ref 3.5–5.1)
Sodium: 133 mmol/L — ABNORMAL LOW (ref 135–145)

## 2016-09-17 LAB — CBG MONITORING, ED: GLUCOSE-CAPILLARY: 123 mg/dL — AB (ref 65–99)

## 2016-09-17 LAB — MAGNESIUM: MAGNESIUM: 1.2 mg/dL — AB (ref 1.7–2.4)

## 2016-09-17 LAB — TROPONIN I: Troponin I: 0.14 ng/mL (ref <0.03)

## 2016-09-17 LAB — BRAIN NATRIURETIC PEPTIDE: B NATRIURETIC PEPTIDE 5: 2727 pg/mL — AB (ref 0.0–100.0)

## 2016-09-17 MED ORDER — ONDANSETRON HCL 4 MG/2ML IJ SOLN: 4.0000 mg | Freq: Four times a day (QID) | INTRAMUSCULAR | Status: DC | PRN

## 2016-09-17 MED ORDER — GABAPENTIN 100 MG PO CAPS: 100.0000 mg | ORAL_CAPSULE | Freq: Every day | ORAL | Status: DC

## 2016-09-17 MED ORDER — SODIUM CHLORIDE 0.9 % IV SOLN: Freq: Once | INTRAVENOUS | Status: DC

## 2016-09-17 MED ORDER — POLYSACCHARIDE IRON COMPLEX 150 MG PO CAPS: 150.0000 mg | ORAL_CAPSULE | Freq: Every day | ORAL | Status: DC

## 2016-09-17 MED ORDER — DILTIAZEM HCL 100 MG IV SOLR
5.0000 mg/h | Freq: Once | INTRAVENOUS | Status: DC
  Filled 2016-09-17: qty 100

## 2016-09-17 MED ORDER — ACETAMINOPHEN 325 MG PO TABS: 650.0000 mg | ORAL_TABLET | ORAL | Status: DC | PRN

## 2016-09-17 MED ORDER — RALOXIFENE HCL 60 MG PO TABS: 60.0000 mg | ORAL_TABLET | Freq: Every day | ORAL | Status: DC

## 2016-09-17 MED ORDER — CALCIUM CARBONATE 600 MG PO TABS: 3600.0000 mg | ORAL_TABLET | Freq: Three times a day (TID) | ORAL | Status: DC

## 2016-09-17 MED ORDER — PANTOPRAZOLE SODIUM 40 MG PO TBEC
40.0000 mg | DELAYED_RELEASE_TABLET | Freq: Every day | ORAL | Status: DC
Start: 2016-09-18 — End: 2016-09-18

## 2016-09-17 MED ORDER — ATORVASTATIN CALCIUM 80 MG PO TABS
80.0000 mg | ORAL_TABLET | Freq: Every day | ORAL | Status: DC
  Filled 2016-09-17: qty 1

## 2016-09-17 MED ORDER — DILTIAZEM HCL 25 MG/5ML IV SOLN
10.0000 mg | Freq: Once | INTRAVENOUS | Status: AC
  Administered 2016-09-17: 10 mg via INTRAVENOUS
  Filled 2016-09-17: qty 5

## 2016-09-17 MED ORDER — FUROSEMIDE 10 MG/ML IJ SOLN
20.0000 mg | Freq: Once | INTRAMUSCULAR | Status: AC
  Administered 2016-09-17: 20 mg via INTRAVENOUS
  Filled 2016-09-17: qty 2

## 2016-09-17 MED ORDER — SODIUM CHLORIDE 0.9 % IV SOLN: 250.0000 mL | INTRAVENOUS | Status: DC | PRN

## 2016-09-17 MED ORDER — POTASSIUM CHLORIDE 10 MEQ/100ML IV SOLN: 10.0000 meq | INTRAVENOUS | Status: AC

## 2016-09-17 MED ORDER — DILTIAZEM HCL 100 MG IV SOLR: 5.0000 mg/h | INTRAVENOUS | Status: DC

## 2016-09-17 MED ORDER — DIAZEPAM 5 MG PO TABS: 5.0000 mg | ORAL_TABLET | Freq: Every evening | ORAL | Status: DC | PRN

## 2016-09-17 MED ORDER — ACETAMINOPHEN 500 MG PO TABS
500.0000 mg | ORAL_TABLET | Freq: Four times a day (QID) | ORAL | Status: DC | PRN
Start: 2016-09-17 — End: 2016-09-18

## 2016-09-17 MED ORDER — SODIUM CHLORIDE 0.9% FLUSH: 3.0000 mL | Freq: Two times a day (BID) | INTRAVENOUS | Status: DC

## 2016-09-17 MED ORDER — HYDROCODONE-ACETAMINOPHEN 5-325 MG PO TABS: 1.0000 | ORAL_TABLET | ORAL | Status: DC | PRN

## 2016-09-17 MED ORDER — SODIUM CHLORIDE 0.9% FLUSH: 3.0000 mL | INTRAVENOUS | Status: DC | PRN

## 2016-09-17 MED ORDER — FUROSEMIDE 10 MG/ML IJ SOLN: 40.0000 mg | Freq: Two times a day (BID) | INTRAMUSCULAR | Status: DC

## 2016-09-17 MED ORDER — APIXABAN 2.5 MG PO TABS: 2.5000 mg | ORAL_TABLET | Freq: Two times a day (BID) | ORAL | Status: DC

## 2016-09-17 MED ORDER — POTASSIUM CHLORIDE CRYS ER 20 MEQ PO TBCR: 20.0000 meq | EXTENDED_RELEASE_TABLET | Freq: Two times a day (BID) | ORAL | Status: DC

## 2016-09-17 NOTE — ED Notes (Signed)
MD at bedside. 

## 2016-09-17 NOTE — ED Notes (Signed)
Pt also c/o sob,

## 2016-09-17 NOTE — ED Notes (Addendum)
CRITICAL VALUE ALERT  Critical value received:  Trop 0.14  Date of notification:  09/16/2016 Time of notification:  2241  Critical value read back:Yes.    Nurse who received alert:  Miroslav Gin   MD notified (1st page): Opyd  Time of first page:  2242    Time MD responded: 2242

## 2016-09-17 NOTE — ED Notes (Signed)
[facility] all questions verified

## 2016-09-17 NOTE — ED Provider Notes (Signed)
[facility]  Department of Emergency Medicine   Code Blue CONSULT NOTE  Chief Complaint: Cardiac arrest/unresponsive   Level V Caveat: Unresponsive  History of present illness: Patient in the emergency department awaiting admission, admitted by previous shift. Patient found to be unresponsive with heart rate in the 30s with brown emesis from her mouth. Patient laid flat and CPR started. ACLS provided for PEA.  ROS: Unable to obtain, Level V caveat  Scheduled Meds: . [START ON 10/04/2016] atorvastatin  80 mg Oral Daily  . [START ON 10/09/2016] calcium carbonate  3,600 mg Oral TID WC  . [START ON 09/30/2016] furosemide  40 mg Intravenous Q12H  . gabapentin  100 mg Oral QHS  . [START ON 09/24/2016] iron polysaccharides  150 mg Oral Daily  . [START ON 09/22/2016] pantoprazole  40 mg Oral Daily  . potassium chloride  20 mEq Oral BID  . [START ON 09/15/2016] raloxifene  60 mg Oral Daily  . sodium chloride flush  3 mL Intravenous Q12H   Continuous Infusions: . sodium chloride    . diltiazem (CARDIZEM) infusion    . potassium chloride     PRN Meds:.sodium chloride, acetaminophen, acetaminophen, diazepam, HYDROcodone-acetaminophen, ondansetron (ZOFRAN) IV, sodium chloride flush Past Medical History:  Diagnosis Date  . Anxiety   . Arteriosclerotic cardiovascular disease (ASCVD)    CABG surgery in 5/96; non-Q MI with normal EF in 04/2003; negative stress nuclear in 02/2009  . Atrial fibrillation (HCC)   . Barrett's esophagus 2005   2008: EGD/NUR-EROSIVE ESOPHAGITIS;  . Cerebrovascular disease    Bilat. bruits; no focal disease 2000-2004; 50-69% R+ L ICA stenosis in 6/09 +plaque  . Chronic kidney disease (CKD), stage III (moderate)    with hyperparathyroidism  . Chronic kidney disease (CKD), stage IV (severe) (HCC) 06/20/2016  . Diabetes mellitus without complication (HCC)   . Diverticulitis    required colostomy-1993  . Hyperlipidemia   . Hypertension   . Irritable bowel  syndrome   . Osteoporosis   . Tobacco abuse, in remission    Quit in 1992   Past Surgical History:  Procedure Laterality Date  . APPENDECTOMY    . BREAST EXCISIONAL BIOPSY     Benign mass  . COLONOSCOPY  2005  . COLOSTOMY  1993   and reversal; ruptured diverticulum  . CORONARY ARTERY BYPASS GRAFT  1996   x5  . ESOPHAGOGASTRODUODENOSCOPY  05/13/2007   Soft stricture at the GE junction with evidence of esophageal scarring and pseudodiverticular formation./  Soft stricture at GE junction which was dilated with a balloon up to 20 mm/  Moderate-sized sliding hiatal hernia.  Marland Kitchen. KIDNEY SURGERY  Age 82  . THYROIDECTOMY, PARTIAL     Goiter  . TONSILLECTOMY    . VENTRAL HERNIA REPAIR     following incarceration with a small bowel obstruction   Social History   Social History  . Marital status: Widowed    Spouse name: N/A  . Number of children: N/A  . Years of education: N/A   Occupational History  . [employer]     Retired   Social History Main Topics  . Smoking status: Former Smoker    Packs/day: 1.00    Years: 30.00    Types: Cigarettes    Start date: 02/22/1952    Quit date: 09/03/1991  . Smokeless tobacco: Never Used     Comment: Quit smoking in 1991  . Alcohol use No  . Drug use: No  . Sexual activity: No  Other Topics Concern  . Not on file   Social History Narrative   Resides with son   Allergies  Allergen Reactions  . Doxycycline Hyclate     REACTION: Nausea, epigastric pain  . Contrast Media [Iodinated Diagnostic Agents] Rash    Premedication required.  . Iohexol Rash    Premedication required   . Tape Rash    Adhesive tape    Last set of Vital Signs (not current) Vitals:   09/23/2016 2215 09/25/2016 2230  BP:  121/71  Pulse:    Resp: (!) 28 25  Temp:        Physical Exam Slow Afib 30s on monitor, no pulses Gen: unresponsive Brown emesis from mouth Cardiovascular: pulseless  Resp: apneic. Breath sounds equal bilaterally with bagging  Abd:  nondistended  Neuro: GCS 3, unresponsive to pain  HEENT: brown emesis in oropharynx, gag reflex absent  Neck: No crepitus  Musculoskeletal: No deformity  Skin: warm  Procedures  INTUBATION Performed by: Glynn Octave Required items: required blood products, implants, devices, and special equipment available Patient identity confirmed: provided demographic data and hospital-assigned identification number Time out: Immediately prior to procedure a "time out" was called to verify the correct patient, procedure, equipment, support staff and site/side marked as required. Indications: cardiac arrest Intubation method: video Preoxygenation: BVM Sedatives: none Paralytic: none Tube Size: 7.5 cuffed Post-procedure assessment: chest rise and ETCO2 monitor Breath sounds: equal and absent over the epigastrium Tube secured by Respiratory Therapy Patient tolerated the procedure well with no immediate complications.  CRITICAL CARE Performed by: Glynn Octave Total critical care time: 60 Critical care time was exclusive of separately billable procedures and treating other patients. Critical care was necessary to treat or prevent imminent or life-threatening deterioration. Critical care was time spent personally by me on the following activities: development of treatment plan with patient and/or surrogate as well as nursing, discussions with consultants, evaluation of patient's response to treatment, examination of patient, obtaining history from patient or surrogate, ordering and performing treatments and interventions, ordering and review of laboratory studies, ordering and review of radiographic studies, pulse oximetry and re-evaluation of patient's condition.  Cardiopulmonary Resuscitation (CPR) Procedure Note  Directed/Performed by: Glynn Octave I personally directed ancillary staff and/or performed CPR in an effort to regain return of spontaneous circulation and to maintain cardiac,  neuro and systemic perfusion.    Medical Decision making  Patient found bradycardic, apneic and pulseless. CPR initiated for PEA arrest. Patient given epinephrine, high quality chest compressions and intubated. Return of spontaneous circulation achieved after approximately 5 minutes of CPR. NG placed with large of amount of dark emesis.  Family contacted. Repeat EKG shows no STEMI.  Labs show lactic acidosis.  RR increased.  Patient's son Madylyn Insco and niece have arrived.  They state patient would not want to be kept alive on life support. The do not desire escalation of care or transfer to [facility].  They do not want CPR if patient's heart were to stop again.  No vasopressors. Patient to be DNR and comfort care.  Family will likely request terminal extubation.  D/w Dr. Antionette Char.     Glynn Octave, MD 10/12/2016 0111

## 2016-09-17 NOTE — ED Triage Notes (Signed)
Pt c/o generalized weakness, neck and back pain, was seen in er yesterday for neck and back pain, family reported to ems that pt was weak when they went to get her up from her lift chair this evening and vomited X1,

## 2016-09-17 NOTE — ED Provider Notes (Signed)
[facility] DEPT Provider Note   CSN: [id number] Arrival date & time: September 30, 2016  1856     History   Chief Complaint Chief Complaint  Patient presents with  . Weakness    HPI [name] is a 82 y.o. female. Seen by myself yesterday with neck pain and discharged home with a diagnosis of torticollis. On low-dose Valium daily at bedtime, and half tab Lortab twice a day. Her niece accompanied her here last night and went home and stayed with the patient. She was probably doing well or symptoms are well-controlled.  Niece states that approximately an hour or 2 before arrival here patient was attempting to stand using her lift chair. She slid from it onto the floor because she states that she was weak. He said notices he seemed short of breath for approximately an hour. She became nauseated and vomited. She denies any chest pain. Was tachypneic. Paramedics were called because family had difficulty standing her and she was transferred here.  History of CAD/CABG. Atrial fibrillation. Chads-vascular score of 5. On Xarelto, and Cardizem.Marland Kitchen Hyperlipidemia on statin. Lasix 40 mg daily. Carotid artery stenosis. Hypertension,   Recent pelvic fracture 7/11. Currently asymptomatic.   HPI  Past Medical History:  Diagnosis Date  . Anxiety   . Arteriosclerotic cardiovascular disease (ASCVD)    CABG surgery in 5/96; non-Q MI with normal EF in 04/2003; negative stress nuclear in 02/2009  . Atrial fibrillation (HCC)   . Barrett's esophagus 2005   2008: EGD/NUR-EROSIVE ESOPHAGITIS;  . Cerebrovascular disease    Bilat. bruits; no focal disease 2000-2004; 50-69% R+ L ICA stenosis in 6/09 +plaque  . Chronic kidney disease (CKD), stage III (moderate)    with hyperparathyroidism  . Chronic kidney disease (CKD), stage IV (severe) (HCC) 06/20/2016  . Diabetes mellitus without complication (HCC)   . Diverticulitis    required colostomy-1993  . Hyperlipidemia   . Hypertension   . Irritable bowel  syndrome   . Osteoporosis   . Tobacco abuse, in remission    Quit in 1992    Patient Active Problem List   Diagnosis Date Noted  . Pelvic fracture (HCC) 06/20/2016  . Chronic kidney disease (CKD), stage IV (severe) (HCC) 06/20/2016  . Atrial fibrillation (HCC) 06/20/2016  . Anemia, normocytic normochromic 06/28/2012  . Hypertension   . Diverticulitis   . Arteriosclerotic cardiovascular disease (ASCVD)   . Cerebrovascular disease   . RENAL DISEASE, CHRONIC, STAGE III 03/11/2008  . Irritable bowel syndrome 06/20/2007  . Barrett's esophagus 04/17/2007  . INSUFFICIENCY, VENOUS NOS 01/17/2007  . Gastroesophageal reflux 01/17/2007  . Hyperlipidemia 11/09/2006  . OSTEOPOROSIS 11/09/2006    Past Surgical History:  Procedure Laterality Date  . APPENDECTOMY    . BREAST EXCISIONAL BIOPSY     Benign mass  . COLONOSCOPY  2005  . COLOSTOMY  1993   and reversal; ruptured diverticulum  . CORONARY ARTERY BYPASS GRAFT  1996   x5  . ESOPHAGOGASTRODUODENOSCOPY  05/13/2007   Soft stricture at the GE junction with evidence of esophageal scarring and pseudodiverticular formation./  Soft stricture at GE junction which was dilated with a balloon up to 20 mm/  Moderate-sized sliding hiatal hernia.  Marland Kitchen KIDNEY SURGERY  Age 19  . THYROIDECTOMY, PARTIAL     Goiter  . TONSILLECTOMY    . VENTRAL HERNIA REPAIR     following incarceration with a small bowel obstruction    OB History    No data available       Home  Medications    Prior to Admission medications   Medication Sig Start Date End Date Taking? Authorizing Provider  acetaminophen (TYLENOL) 500 MG tablet Take 500 mg by mouth every 6 (six) hours as needed for mild pain or moderate pain.    Yes Historical Provider, MD  ALPRAZolam Prudy Feeler(XANAX) 0.25 MG tablet Take 0.5 mg by mouth daily.    Yes Historical Provider, MD  atorvastatin (LIPITOR) 80 MG tablet TAKE ONE TABLET BY MOUTH DAILY. 02/09/16  Yes Laqueta LindenSuresh A Koneswaran, MD  calcium carbonate  (OS-CAL) 600 MG TABS Take 3,600 mg by mouth 3 (three) times daily with meals.    Yes Historical Provider, MD  diazepam (VALIUM) 2 MG tablet Take 2.5 tablets (5 mg total) by mouth at bedtime as needed for muscle spasms. 09/16/16  Yes Rolland PorterMark Ramiah Helfrich, MD  diltiazem (CARDIZEM CD) 180 MG 24 hr capsule TAKE ONE CAPSULE BY MOUTH DAILY 06/29/16  Yes Laqueta LindenSuresh A Koneswaran, MD  ELIQUIS 2.5 MG TABS tablet TAKE ONE TABLET BY MOUTH TWICE DAILY 06/29/16  Yes Laqueta LindenSuresh A Koneswaran, MD  FIBER COMPLETE PO Take 2 tablets by mouth 2 (two) times daily.     Yes Historical Provider, MD  furosemide (LASIX) 20 MG tablet Take 40 mg by mouth daily.    Yes Historical Provider, MD  gabapentin (NEURONTIN) 100 MG capsule Take 100 mg by mouth at bedtime.   Yes Historical Provider, MD  HYDROcodone-acetaminophen (NORCO/VICODIN) 5-325 MG tablet Take 2 tablets by mouth every 8 (eight) hours as needed. Patient taking differently: Take 0.5 tablets by mouth every 8 (eight) hours as needed.  09/16/16  Yes Rolland PorterMark Jamel Dunton, MD  iron polysaccharides (NIFEREX) 150 MG capsule Take 150 mg by mouth daily.   Yes Historical Provider, MD  linagliptin (TRADJENTA) 5 MG TABS tablet Take 5 mg by mouth daily.   Yes Historical Provider, MD  Multiple Vitamins-Minerals (CENTRUM SILVER PO) Take 1 tablet by mouth daily.     Yes Historical Provider, MD  nystatin (MYCOSTATIN/NYSTOP) powder APPLY A SMALL AMOUNT ON THE SKIN AS DIRECTED. MAY USE TWICE DAILY FOR 1-2 WEEKS FOR RASH. 08/31/16  Yes Historical Provider, MD  pantoprazole (PROTONIX) 40 MG tablet Take 40 mg by mouth daily. 03/08/16  Yes Historical Provider, MD  Probiotic Product (PROBIOTIC DAILY PO) Take by mouth.   Yes Historical Provider, MD  raloxifene (EVISTA) 60 MG tablet Take 60 mg by mouth daily.     Yes Historical Provider, MD  traMADol (ULTRAM) 50 MG tablet Take 50-100 mg by mouth daily.    Yes Historical Provider, MD  dicyclomine (BENTYL) 10 MG capsule 1-2 PO 30  MINUTES PRIOR TO MEALS AND AT BEDTIME TO TREAT  DIARRHEA AND ABD PAIN Patient not taking: Reported on 09/27/2016 07/22/15   Anice PaganiniEric A Gill, NP    Family History Family History  Problem Relation Age of Onset  . Coronary artery disease Father   . Heart failure Mother     Social History Social History  Substance Use Topics  . Smoking status: Former Smoker    Packs/day: 1.00    Years: 30.00    Types: Cigarettes    Start date: 02/22/1952    Quit date: 09/03/1991  . Smokeless tobacco: Never Used     Comment: Quit smoking in 1991  . Alcohol use No     Allergies   Doxycycline hyclate; Contrast media [iodinated diagnostic agents]; Iohexol; and Tape   Review of Systems Review of Systems  Constitutional: Positive for fatigue. Negative for appetite change, chills, diaphoresis  and fever.  HENT: Negative for mouth sores, sore throat and trouble swallowing.   Eyes: Negative for visual disturbance.  Respiratory: Positive for shortness of breath. Negative for cough, chest tightness and wheezing.   Cardiovascular: Negative for chest pain and leg swelling.  Gastrointestinal: Positive for nausea. Negative for abdominal distention, abdominal pain, diarrhea and vomiting.  Endocrine: Negative for polydipsia, polyphagia and polyuria.  Genitourinary: Negative for dysuria, frequency and hematuria.  Musculoskeletal: Positive for neck pain. Negative for gait problem.  Skin: Negative for color change, pallor and rash.  Neurological: Positive for weakness. Negative for dizziness, syncope, light-headedness and headaches.  Hematological: Does not bruise/bleed easily.  Psychiatric/Behavioral: Negative for behavioral problems and confusion.     Physical Exam Updated Vital Signs BP 107/58 (BP Location: Left Arm)   Pulse 104   Temp 99.1 F (37.3 C) (Rectal)   Resp 26   Ht 5\' 6"  (1.676 m)   Wt 145 lb (65.8 kg)   SpO2 96%   BMI 23.40 kg/m   Physical Exam  Constitutional: She is oriented to person, place, and time. She appears well-developed and  well-nourished. No distress.  This is an 82 year old female who appears different than my examination of her last night. Last night after her symptoms were controlled with medication she was interactive, almost jovial smiling. Tonight she is preserved. She is speaking with 2-3 word dyspnea.  HENT:  Head: Normocephalic.  Jugular not pale  Eyes: Conjunctivae are normal. Pupils are equal, round, and reactive to light. No scleral icterus.  Neck: Normal range of motion. Neck supple. No thyromegaly present.  JVD at 45.  Cardiovascular: Normal rate and regular rhythm.  Exam reveals no gallop and no friction rub.   No murmur heard. A. fib with RVR at 135.  Pulmonary/Chest: Effort normal and breath sounds normal. No respiratory distress. She has no wheezes. She has no rales.  Diffuse rhonchi.  Abdominal: Soft. Bowel sounds are normal. She exhibits no distension. There is no tenderness. There is no rebound.  Musculoskeletal: Normal range of motion.  Neurological: She is alert and oriented to person, place, and time.  Skin: Skin is warm and dry. No rash noted.  No marked extremity edema.  Psychiatric: She has a normal mood and affect. Her behavior is normal.     ED Treatments / Results  Labs (all labs ordered are listed, but only abnormal results are displayed) Labs Reviewed  URINALYSIS, ROUTINE W REFLEX MICROSCOPIC (NOT AT [facility]) - Abnormal; Notable for the following:       Result Value   Hgb urine dipstick MODERATE (*)    Protein, ur 100 (*)    All other components within normal limits  URINE MICROSCOPIC-ADD ON - Abnormal; Notable for the following:    Squamous Epithelial / LPF 0-5 (*)    Bacteria, UA FEW (*)    All other components within normal limits  CBG MONITORING, ED - Abnormal; Notable for the following:    Glucose-Capillary 123 (*)    All other components within normal limits  URINE CULTURE  CBC WITH DIFFERENTIAL/PLATELET  BASIC METABOLIC PANEL  TROPONIN I  LACTIC ACID,  PLASMA  LACTIC ACID, PLASMA  BRAIN NATRIURETIC PEPTIDE    EKG  EKG Interpretation  Date/Time:  Sunday 09/11/2016 19:05:07 EDT Ventricular Rate:  102 PR Interval:    QRS Duration: 79 QT Interval:  328 QTC Calculation: 428 R Axis:   40 Text Interpretation:  Atrial fibrillation Borderline T abnormalities, anterior leads Confirmed by [facility]  MD, Destane Speas ([id number]) on 09/15/2016 7:08:13 PM       Radiology Dg Chest Portable 1 View  Result Date: 10/07/2016 CLINICAL DATA:  Shortness of breath.  Generalized weakness. EXAM: PORTABLE CHEST 1 VIEW COMPARISON:  02/03/2009 FINDINGS: Postsurgical changes from CABG is stable. Patient is rotated to the left. Cardiomediastinal silhouette is normal for portable technique. Mediastinal contours appear intact. There is no evidence of pneumothorax. There is mild interstitial pulmonary edema. Bilateral small subpulmonic effusions cannot be excluded. There is a soft tissue thickening in the right peritracheal stripe with uncertain significance. Osseous structures are without acute abnormality. Soft tissues are grossly normal. IMPRESSION: Mild cardiomegaly. Interstitial pulmonary edema with possible bilateral small subpulmonic pleural effusions. Soft tissue thickening in the right peritracheal stripe with uncertain significance. Given this is a portable exam, this may represent summation of shadows. PA and lateral radiograph of the chest when clinically feasible may be considered to follow-up of this finding. Electronically Signed   By: Ted Mcalpine M.D.   On: 09/13/2016 20:10    Procedures Procedures (including critical care time)  Medications Ordered in ED Medications  0.9 %  sodium chloride infusion (not administered)  diltiazem (CARDIZEM) 100 mg in dextrose 5 % 100 mL (1 mg/mL) infusion (not administered)  diltiazem (CARDIZEM) injection 10 mg (10 mg Intravenous Given 10/08/2016 2045)  furosemide (LASIX) injection 20 mg (20 mg Intravenous Given 09/29/2016  2045)     Initial Impression / Assessment and Plan / ED Course  I have reviewed the triage vital signs and the nursing notes.  Pertinent labs & imaging results that were available during my care of the patient were reviewed by me and considered in my medical decision making (see chart for details).  Clinical Course    EKG confirms atrial fibrillation with rapid ventricular response. Chest x-ray shows congestive heart failure. Given 20 IV Lasix. Given 10 mg IV Cardizem and started on drip at 5 per hour. Has had some diuresis here. Clinically states that she feels improved. Speaking in 4-5 words. Maintaining saturations mid 90s on 2 L. Awaiting completion labs. Overall patient looks much improved. Will require admission.  Final Clinical Impressions(s) / ED Diagnoses   Final diagnoses:  Paroxysmal atrial fibrillation (HCC)  Congestive heart failure, unspecified congestive heart failure chronicity, unspecified congestive heart failure type (HCC)    CRITICAL CARE Performed by: Rolland Porter JOSEPH   Total critical care time: 30 minutes  Critical care time was exclusive of separately billable procedures and treating other patients.  Critical care was necessary to treat or prevent imminent or life-threatening deterioration.  Critical care was time spent personally by me on the following activities: development of treatment plan with patient and/or surrogate as well as nursing, discussions with consultants, evaluation of patient's response to treatment, examination of patient, obtaining history from patient or surrogate, ordering and performing treatments and interventions, ordering and review of laboratory studies, ordering and review of radiographic studies, pulse oximetry and re-evaluation of patient's condition.   New Prescriptions New Prescriptions   No medications on file     Rolland Porter, MD 10/07/2016 2145

## 2016-09-17 NOTE — H&P (Signed)
History and Physical    [name] UJW:[id number] DOB: 01/06/1928 DOA: 09/23/2016  PCP: Dwana MelenaZack Hall, MD   Patient coming from: Home   Chief Complaint: Dyspnea, generalized weakness   HPI: [name] is a 82 y.o. female with medical history significant for CAD status post CABG, GERD with esophagitis, chronic kidney disease stage IV, chronic atrial fibrillation, type 2 diabetes mellitus, and chronic diastolic CHF who presents to the emergency department with dyspnea and generalized weakness. Patient reports being dyspneic and weak today. Her family became concerned when she needed intensive assistance to get up from her chair. EMS was activated for transport to the hospital. There has been no recent fevers or chills and the patient has denied chest pain or palpitations. She denies a cough, but reports dyspnea while at rest, worse with exertion. There is been no headache, change in vision or hearing, or focal numbness or weakness reported. There is been no recent sick contacts or long distance travel. There has been no recent trauma or fall.  ED Course: Upon arrival to the ED, patient is found to be afebrile, saturating adequately on 4 L/m of supplemental oxygen, tachypneic in the 120s, tachycardic in the 120s, and was stable blood pressure. EKG demonstrates atrial fibrillation with rate 102 and chest x-ray is notable for interstitial pulmonary edema. Chemistry panel features a hyponatremia and hypokalemia, as well as serum creatinine of 2.34, up from 1.65 in July of this year. CBC features a thrombocytopenia with platelet count 93,000. BNP returned elevated to 2727 and troponin is also elevated at 0.14. Urinalysis is unremarkable. He shouldn't had heart rate in the 120s to 130s, was given a 10 mg IV push of diltiazem, and started on diltiazem infusion. She was given 20 mg IV push of Lasix in the ED. Heart rate improved with the diltiazem infusion but the patient remained tachypneic and with  significant requirement for supplemental oxygen. She'll be admitted to the stepdown unit for ongoing evaluation and management of acute on chronic diastolic CHF, suspected secondary to atrial fibrillation with RVR.  Review of Systems:  All other systems reviewed and apart from HPI, are negative.  Past Medical History:  Diagnosis Date  . Anxiety   . Arteriosclerotic cardiovascular disease (ASCVD)    CABG surgery in 5/96; non-Q MI with normal EF in 04/2003; negative stress nuclear in 02/2009  . Atrial fibrillation (HCC)   . Barrett's esophagus 2005   2008: EGD/NUR-EROSIVE ESOPHAGITIS;  . Cerebrovascular disease    Bilat. bruits; no focal disease 2000-2004; 50-69% R+ L ICA stenosis in 6/09 +plaque  . Chronic kidney disease (CKD), stage III (moderate)    with hyperparathyroidism  . Chronic kidney disease (CKD), stage IV (severe) (HCC) 06/20/2016  . Diabetes mellitus without complication (HCC)   . Diverticulitis    required colostomy-1993  . Hyperlipidemia   . Hypertension   . Irritable bowel syndrome   . Osteoporosis   . Tobacco abuse, in remission    Quit in 1992    Past Surgical History:  Procedure Laterality Date  . APPENDECTOMY    . BREAST EXCISIONAL BIOPSY     Benign mass  . COLONOSCOPY  2005  . COLOSTOMY  1993   and reversal; ruptured diverticulum  . CORONARY ARTERY BYPASS GRAFT  1996   x5  . ESOPHAGOGASTRODUODENOSCOPY  05/13/2007   Soft stricture at the GE junction with evidence of esophageal scarring and pseudodiverticular formation./  Soft stricture at GE junction which was dilated with a balloon up  to 20 mm/  Moderate-sized sliding hiatal hernia.  [facility] Kitchen KIDNEY SURGERY  Age 72  . THYROIDECTOMY, PARTIAL     Goiter  . TONSILLECTOMY    . VENTRAL HERNIA REPAIR     following incarceration with a small bowel obstruction     reports that she quit smoking about 25 years ago. Her smoking use included Cigarettes. She started smoking about 64 years ago. She has a 30.00 pack-year  smoking history. She has never used smokeless tobacco. She reports that she does not drink alcohol or use drugs.  Allergies  Allergen Reactions  . Doxycycline Hyclate     REACTION: Nausea, epigastric pain  . Contrast Media [Iodinated Diagnostic Agents] Rash    Premedication required.  . Iohexol Rash    Premedication required   . Tape Rash    Adhesive tape    Family History  Problem Relation Age of Onset  . Coronary artery disease Father   . Heart failure Mother      Prior to Admission medications   Medication Sig Start Date End Date Taking? Authorizing Provider  acetaminophen (TYLENOL) 500 MG tablet Take 500 mg by mouth every 6 (six) hours as needed for mild pain or moderate pain.    Yes Historical Provider, MD  ALPRAZolam Prudy Feeler) 0.25 MG tablet Take 0.5 mg by mouth daily.    Yes Historical Provider, MD  atorvastatin (LIPITOR) 80 MG tablet TAKE ONE TABLET BY MOUTH DAILY. 02/09/16  Yes Laqueta Linden, MD  calcium carbonate (OS-CAL) 600 MG TABS Take 3,600 mg by mouth 3 (three) times daily with meals.    Yes Historical Provider, MD  diazepam (VALIUM) 2 MG tablet Take 2.5 tablets (5 mg total) by mouth at bedtime as needed for muscle spasms. 09/16/16  Yes Rolland Porter, MD  diltiazem (CARDIZEM CD) 180 MG 24 hr capsule TAKE ONE CAPSULE BY MOUTH DAILY 06/29/16  Yes Laqueta Linden, MD  ELIQUIS 2.5 MG TABS tablet TAKE ONE TABLET BY MOUTH TWICE DAILY 06/29/16  Yes Laqueta Linden, MD  FIBER COMPLETE PO Take 2 tablets by mouth 2 (two) times daily.     Yes Historical Provider, MD  furosemide (LASIX) 20 MG tablet Take 40 mg by mouth daily.    Yes Historical Provider, MD  gabapentin (NEURONTIN) 100 MG capsule Take 100 mg by mouth at bedtime.   Yes Historical Provider, MD  HYDROcodone-acetaminophen (NORCO/VICODIN) 5-325 MG tablet Take 2 tablets by mouth every 8 (eight) hours as needed. Patient taking differently: Take 0.5 tablets by mouth every 8 (eight) hours as needed.  09/16/16  Yes Rolland Porter, MD  iron polysaccharides (NIFEREX) 150 MG capsule Take 150 mg by mouth daily.   Yes Historical Provider, MD  linagliptin (TRADJENTA) 5 MG TABS tablet Take 5 mg by mouth daily.   Yes Historical Provider, MD  Multiple Vitamins-Minerals (CENTRUM SILVER PO) Take 1 tablet by mouth daily.     Yes Historical Provider, MD  nystatin (MYCOSTATIN/NYSTOP) powder APPLY A SMALL AMOUNT ON THE SKIN AS DIRECTED. MAY USE TWICE DAILY FOR 1-2 WEEKS FOR RASH. 08/31/16  Yes Historical Provider, MD  pantoprazole (PROTONIX) 40 MG tablet Take 40 mg by mouth daily. 03/08/16  Yes Historical Provider, MD  Probiotic Product (PROBIOTIC DAILY PO) Take by mouth.   Yes Historical Provider, MD  raloxifene (EVISTA) 60 MG tablet Take 60 mg by mouth daily.     Yes Historical Provider, MD  traMADol (ULTRAM) 50 MG tablet Take 50-100 mg by mouth daily.  Yes Historical Provider, MD  dicyclomine (BENTYL) 10 MG capsule 1-2 PO 30  MINUTES PRIOR TO MEALS AND AT BEDTIME TO TREAT DIARRHEA AND ABD PAIN Patient not taking: Reported on 10/07/2016 07/22/15   Anice Paganini, NP    Physical Exam: Vitals:   09/17/2016 2145 09/27/2016 2200 10/06/2016 2215 09/16/2016 2230  BP:  (!) 128/112  121/71  Pulse:      Resp: 26 (!) 29 (!) 28 25  Temp:      TempSrc:      SpO2:      Weight:      Height:          Constitutional: In respiratory distress with tachypnea and accessory muscle recruitment, no pallor Eyes: PERTLA, lids and conjunctivae normal ENMT: Mucous membranes are moist. Posterior pharynx clear of any exudate or lesions.   Neck: normal, supple, no masses, no thyromegaly Respiratory: Bibasilar crackles. increased WOB.     Cardiovascular: Rate ~110 and irregular. Trace bilateral pretibial edema. Neck veins moderately distended. Abdomen: No distension, no tenderness, no masses palpated. Bowel sounds normal.  Musculoskeletal: no clubbing / cyanosis. No joint deformity upper and lower extremities. Normal muscle tone.  Skin: no significant  rashes, lesions, ulcers. Warm, dry, well-perfused. Neurologic: CN 2-12 grossly intact. Sensation intact, DTR normal. Strength 5/5 in all 4 limbs.  Psychiatric: Normal judgment and insight. Alert and oriented x 3. Normal mood and affect.     Labs on Admission: I have personally reviewed following labs and imaging studies  CBC:  Recent Labs Lab 09/17/2016 2117  WBC 9.5  NEUTROABS 7.4  HGB 12.2  HCT 38.7  MCV 92.8  PLT 93*   Basic Metabolic Panel:  Recent Labs Lab 09/19/2016 2117  NA 133*  K 3.2*  CL 93*  CO2 29  GLUCOSE 111*  BUN 47*  CREATININE 2.34*  CALCIUM 9.6   GFR: Estimated Creatinine Clearance: 16.5 mL/min (by C-G formula based on SCr of 2.34 mg/dL (H)). Liver Function Tests: No results for input(s): AST, ALT, ALKPHOS, BILITOT, PROT, ALBUMIN in the last 168 hours. No results for input(s): LIPASE, AMYLASE in the last 168 hours. No results for input(s): AMMONIA in the last 168 hours. Coagulation Profile: No results for input(s): INR, PROTIME in the last 168 hours. Cardiac Enzymes:  Recent Labs Lab 09/14/2016 2117  TROPONINI 0.14*   BNP (last 3 results) No results for input(s): PROBNP in the last 8760 hours. HbA1C: No results for input(s): HGBA1C in the last 72 hours. CBG:  Recent Labs Lab 09/11/2016 1907  GLUCAP 123*   Lipid Profile: No results for input(s): CHOL, HDL, LDLCALC, TRIG, CHOLHDL, LDLDIRECT in the last 72 hours. Thyroid Function Tests: No results for input(s): TSH, T4TOTAL, FREET4, T3FREE, THYROIDAB in the last 72 hours. Anemia Panel: No results for input(s): VITAMINB12, FOLATE, FERRITIN, TIBC, IRON, RETICCTPCT in the last 72 hours. Urine analysis:    Component Value Date/Time   COLORURINE YELLOW 09/29/2016 2009   APPEARANCEUR CLEAR September 18, 2016 2009   LABSPEC 1.010 Sep 18, 2016 2009   PHURINE 5.5 10/07/2016 2009   GLUCOSEU NEGATIVE 18-Sep-2016 2009   HGBUR MODERATE (A) 09/15/2016 2009   HGBUR negative 05/07/2008 1009   BILIRUBINUR  NEGATIVE September 18, 2016 2009   KETONESUR NEGATIVE 10/01/2016 2009   PROTEINUR 100 (A) 09/24/2016 2009   UROBILINOGEN negative 05/07/2008 1009   NITRITE NEGATIVE 18-Sep-2016 2009   LEUKOCYTESUR NEGATIVE 09/27/2016 2009   Sepsis Labs: @LABRCNTIP (procalcitonin:4,lacticidven:4) )No results found for this or any previous visit (from the past 240 hour(s)).  Radiological Exams on Admission: Dg Chest Portable 1 View  Result Date: 10/06/2016 CLINICAL DATA:  Shortness of breath.  Generalized weakness. EXAM: PORTABLE CHEST 1 VIEW COMPARISON:  02/03/2009 FINDINGS: Postsurgical changes from CABG is stable. Patient is rotated to the left. Cardiomediastinal silhouette is normal for portable technique. Mediastinal contours appear intact. There is no evidence of pneumothorax. There is mild interstitial pulmonary edema. Bilateral small subpulmonic effusions cannot be excluded. There is a soft tissue thickening in the right peritracheal stripe with uncertain significance. Osseous structures are without acute abnormality. Soft tissues are grossly normal. IMPRESSION: Mild cardiomegaly. Interstitial pulmonary edema with possible bilateral small subpulmonic pleural effusions. Soft tissue thickening in the right peritracheal stripe with uncertain significance. Given this is a portable exam, this may represent summation of shadows. PA and lateral radiograph of the chest when clinically feasible may be considered to follow-up of this finding. Electronically Signed   By: Ted Mcalpine M.D.   On: 09/30/2016 20:10    EKG: Independently reviewed. Atrial fibrillation, rate 102   Assessment/Plan  1. Atrial fibrillation with RVR  - Responding well to diltiazem infusion, will transition to PO as able  - CHADS-VASc at least 61 (age x2, gender, CHF, HTN, DM, CAD)  - Eliquis held at time of admission as GFR <30; monitor renal fxn and resume as tolerated  - Monitor on telemetry in stepdown unit until diltiazem infusion no  longer needed   2. Acute on chronic diastolic CHF  - Presents with dyspnea and has edema on CXR, elevated BNP, JVD - Given 20 IV Lasix in ED, will continue diuresis with 40 mg IV Lasix q12h  - Follow strict I/O's and daily wts; fluid-restrict diet and SLIV   3. AKI superimposed on CKD stage IV  - SCr 2.34 on admission, up from 1.65 in July 2017  - Likely secondary to a fib RVR and poor perfusion d/t reduced CO - Managing rate and fluid o/l as above  - Follow chem panel qAM    4. CAD, elevated troponin  - Hx of CABG; no CP on arrival  - EKG described above, no acute ischemic features  - Trop elevated to 0.14, likely secondary to acute CHF and uncontrolled HR  - Monitor on telemetry, trend troponin, continue daily ASA, Lipitor   5. Thrombocytopenia  - Platelets 93,000 and worse than prior  - No s/s of bleeding  - Continue iron-supplementation, monitor   6. Type II DM  - Managed with Tradjenta at home, will hold  - Check CBG with meals and qHS  - Start with a low-intensity sliding-scale insulin only    7. Hypokalemia  - Serum potassium 3.2 on admission  - Given 20 mEq oral and 20 mEq IV potassium  - Magnesium level pending; replete prn  - Repeat chem panel with mag level in am    DVT prophylaxis: SCD's  Code Status: Full  Family Communication: Discussed with patient Disposition Plan: Admit to stepdown Consults called: None Admission status: Inpatient    Briscoe Deutscher, MD [facility] Pager [phone]  If 7PM-7AM, please contact night-coverage [email or web address] Password [email or web address]  09/11/2016, 11:28 PM

## 2016-09-18 DIAGNOSIS — I10 Essential (primary) hypertension: Secondary | ICD-10-CM

## 2016-09-18 DIAGNOSIS — I480 Paroxysmal atrial fibrillation: Secondary | ICD-10-CM

## 2016-09-18 LAB — BLOOD GAS, ARTERIAL
Acid-Base Excess: 13.9 mmol/L — ABNORMAL HIGH (ref 0.0–2.0)
BICARBONATE: 13.2 mmol/L — AB (ref 20.0–28.0)
Drawn by: 317771
FIO2: 1
O2 CONTENT: 100 L/min
O2 Saturation: 98.4 %
PCO2 ART: 43.5 mmHg (ref 32.0–48.0)
PEEP: 5 cmH2O
PO2 ART: 236 mmHg — AB (ref 83.0–108.0)
RATE: 15 resp/min
VT: 500 mL
pH, Arterial: 7.124 — CL (ref 7.350–7.450)

## 2016-09-18 LAB — BASIC METABOLIC PANEL
ANION GAP: 17 — AB (ref 5–15)
BUN: 48 mg/dL — ABNORMAL HIGH (ref 6–20)
CALCIUM: 10.3 mg/dL (ref 8.9–10.3)
CO2: 24 mmol/L (ref 22–32)
Chloride: 92 mmol/L — ABNORMAL LOW (ref 101–111)
Creatinine, Ser: 2.74 mg/dL — ABNORMAL HIGH (ref 0.44–1.00)
GFR calc Af Amer: 17 mL/min — ABNORMAL LOW (ref >60)
GFR calc non Af Amer: 15 mL/min — ABNORMAL LOW (ref >60)
GLUCOSE: 182 mg/dL — AB (ref 65–99)
POTASSIUM: 3.9 mmol/L (ref 3.5–5.1)
Sodium: 133 mmol/L — ABNORMAL LOW (ref 135–145)

## 2016-09-18 LAB — CBC WITH DIFFERENTIAL/PLATELET
BASOS ABS: 0 10*3/uL (ref 0.0–0.1)
Basophils Relative: 0 %
Eosinophils Absolute: 0 10*3/uL (ref 0.0–0.7)
Eosinophils Relative: 0 %
HEMATOCRIT: 39.2 % (ref 36.0–46.0)
Hemoglobin: 12 g/dL (ref 12.0–15.0)
LYMPHS PCT: 29 %
Lymphs Abs: 2.6 10*3/uL (ref 0.7–4.0)
MCH: 29.6 pg (ref 26.0–34.0)
MCHC: 30.6 g/dL (ref 30.0–36.0)
MCV: 96.6 fL (ref 78.0–100.0)
MONO ABS: 0.7 10*3/uL (ref 0.1–1.0)
Monocytes Relative: 8 %
NEUTROS ABS: 5.4 10*3/uL (ref 1.7–7.7)
Neutrophils Relative %: 63 %
Platelets: 89 10*3/uL — ABNORMAL LOW (ref 150–400)
RBC: 4.06 MIL/uL (ref 3.87–5.11)
RDW: 14.7 % (ref 11.5–15.5)
WBC: 8.7 10*3/uL (ref 4.0–10.5)

## 2016-09-18 LAB — I-STAT TROPONIN, ED: TROPONIN I, POC: 0.25 ng/mL — AB (ref 0.00–0.08)

## 2016-09-18 LAB — T4, FREE: Free T4: 1.45 ng/dL — ABNORMAL HIGH (ref 0.61–1.12)

## 2016-09-18 LAB — I-STAT CG4 LACTIC ACID, ED: Lactic Acid, Venous: 11.87 mmol/L (ref 0.5–1.9)

## 2016-09-18 LAB — TROPONIN I: Troponin I: 0.23 ng/mL (ref <0.03)

## 2016-09-18 LAB — TSH: TSH: 1.357 u[IU]/mL (ref 0.350–4.500)

## 2016-09-18 LAB — PROTIME-INR
INR: 2.04
Prothrombin Time: 23.3 seconds — ABNORMAL HIGH (ref 11.4–15.2)

## 2016-09-18 MED ORDER — SODIUM CHLORIDE 0.9 % IV SOLN
10.0000 mg/h | INTRAVENOUS | Status: DC
  Filled 2016-09-18: qty 10

## 2016-09-18 MED ORDER — MORPHINE BOLUS VIA INFUSION
5.0000 mg | INTRAVENOUS | Status: DC | PRN
Start: 2016-09-18 — End: 2016-09-18
  Filled 2016-09-18: qty 20

## 2016-09-18 MED ORDER — NOREPINEPHRINE BITARTRATE 1 MG/ML IV SOLN
0.0000 ug/min | INTRAVENOUS | Status: DC
  Filled 2016-09-18: qty 4

## 2016-09-18 MED ORDER — LORAZEPAM 2 MG/ML IJ SOLN
5.0000 mg/h | INTRAVENOUS | Status: DC
  Filled 2016-09-18: qty 25

## 2016-09-18 MED ORDER — LORAZEPAM BOLUS VIA INFUSION
2.0000 mg | INTRAVENOUS | Status: DC | PRN
  Filled 2016-09-18: qty 5

## 2016-09-19 LAB — URINE CULTURE: Culture: NO GROWTH

## 2016-09-20 DIAGNOSIS — I480 Paroxysmal atrial fibrillation: Secondary | ICD-10-CM | POA: Insufficient documentation

## 2016-09-26 MED FILL — Medication: Qty: 1 | Status: AC

## 2016-10-11 NOTE — Procedures (Signed)
Extubation Procedure Note  Patient Details:   Name: [name] DOB: 09/01/1928 MRN: [id number]   Airway Documentation:  Airway 7.5 mm (Active)  Secured at (cm) 22 cm 10/05/2016 11:52 PM  Measured From Lips 09/30/2016 11:52 PM  Secured Location Center 09/24/2016 11:52 PM  Secured By [employer] Tube Holder 09/19/2016 11:52 PM  Site Condition Dry 10/10/2016 11:52 PM    Evaluation  O2 sats: stable throughout Complications: No apparent complications Patient did tolerate procedure well.     No  Adolm JosephFretwell, [name] 09/17/2016, 1:04 AM

## 2016-10-11 NOTE — Discharge Summary (Signed)
Death Summary  [name] NGE:[id number] DOB: 02/25/1928 DOA: 09/26/2016  PCP: Dwana MelenaZack Hall, MD  Admit date: 10/10/2016 Date of Death: 09/23/2016 Time of Death: 01:17 am  Notification: Hortense RamalRoger Remillard notified of death of 09/11/2016   History of present illness:  [name] is a 82 y.o. female with medical history significant for CAD status post CABG, GERD with esophagitis, chronic kidney disease stage IV, chronic atrial fibrillation, type 2 diabetes mellitus, and chronic diastolic CHF who presented to the emergency department with dyspnea and generalized weakness.  Upon arrival to the ED, she was found to be afebrile, saturating adequately on 4 L/m of supplemental oxygen, tachypneic in the 120s, tachycardic in the 120s, and was stable blood pressure. EKG demonstrates atrial fibrillation with rate 102 and chest x-ray is notable for interstitial pulmonary edema. Chemistry panel features a hyponatremia and hypokalemia, as well as serum creatinine of 2.34, up from 1.65 in July of this year. CBC features a thrombocytopenia with platelet count 93,000. BNP returned elevated to 2727 and troponin is also elevated at 0.14. Urinalysis is unremarkable. He shouldn't had heart rate in the 120s to 130s, was given a 10 mg IV push of diltiazem, and started on diltiazem infusion. She was given 20 mg IV push of Lasix in the ED. Heart rate improved with the diltiazem infusion but the patient remained tachypneic and with significant requirement for supplemental oxygen. She was admitted to the stepdown unit for ongoing evaluation and management of acute on chronic diastolic CHF, suspected secondary to atrial fibrillation with RVR.  The patient unfortunately suffered a PEA arrest shortly after admission, while still in the emergency department. She was intubated and ACLS was directed by the ED physician as detailed in his event note.  The patient has one child, Hortense RamalRoger Shiflett, who was reached by phone and came to the  hospital to discuss his mother's condition and the goals of care going forward. Mr. Christell Silva reports that he had discussed the patient's wishes with her in the past and she had indicated to him that she would not want to be kept on life-support. Hortense Ramaloger Efaw asked that, in line with what he believes to have been his mother's wishes, that we do not escalate care. CODE STATUS was changed to DO NOT RESUSCITATE. After spending time with his mother at the bedside, Mr. Christell Silva asked that she be extubated and made comfortable. He was aware that she would likely die within minutes of extubation. Patient was extubated and provided comfort measures. [name] expired at 01:17 in the presence of her son.   Final Diagnoses:  1. Atrial fibrillation with RVR 2. Acute on chronic diastolic CHF  3. Aspiration    The results of significant diagnostics from this hospitalization (including imaging, microbiology, ancillary and laboratory) are listed below for reference.    Significant Diagnostic Studies: Dg Chest Portable 1 View  Result Date: 10/07/2016 CLINICAL DATA:  Endotracheal tube and nasogastric tube placement EXAM: PORTABLE CHEST 1 VIEW COMPARISON:  Chest radiograph 10/05/2016 FINDINGS: Endotracheal tube tip is just below the level of the clavicular heads. The distal portion of the nasogastric tube this obscured thigh overlying EKG leads and is not visualized beyond the diaphragm. There is cardiomegaly with atherosclerotic calcification of the thoracic aorta. CABG markers and median sternotomy wires are seen. There is moderate pulmonary edema, worsened from the prior study. IMPRESSION: 1. Endotracheal tube tip is below the level of the clavicles. 2. Distal portion of the NG tube is not visualized due  to overlying EKG leads. 3. Cardiomegaly and moderate pulmonary edema, slightly worsened from the prior study. 4. Aortic atherosclerosis. Electronically Signed   By: Deatra Robinson M.D.   On: 09/15/2016 00:43   Dg Chest  Portable 1 View  Result Date: 02-Oct-2016 CLINICAL DATA:  Shortness of breath.  Generalized weakness. EXAM: PORTABLE CHEST 1 VIEW COMPARISON:  02/03/2009 FINDINGS: Postsurgical changes from CABG is stable. Patient is rotated to the left. Cardiomediastinal silhouette is normal for portable technique. Mediastinal contours appear intact. There is no evidence of pneumothorax. There is mild interstitial pulmonary edema. Bilateral small subpulmonic effusions cannot be excluded. There is a soft tissue thickening in the right peritracheal stripe with uncertain significance. Osseous structures are without acute abnormality. Soft tissues are grossly normal. IMPRESSION: Mild cardiomegaly. Interstitial pulmonary edema with possible bilateral small subpulmonic pleural effusions. Soft tissue thickening in the right peritracheal stripe with uncertain significance. Given this is a portable exam, this may represent summation of shadows. PA and lateral radiograph of the chest when clinically feasible may be considered to follow-up of this finding. Electronically Signed   By: Ted Mcalpine M.D.   On: 10/02/16 20:10    Microbiology: No results found for this or any previous visit (from the past 240 hour(s)).   Labs: Basic Metabolic Panel:  Recent Labs Lab 02-Oct-2016 2117 10/02/2016 2342 10-02-16 2355  NA 133* 133* 131*  K 3.2* 3.9 3.8  CL 93* 92* 92*  CO2 29 24  --   GLUCOSE 111* 182* 172*  BUN 47* 48* 47*  CREATININE 2.34* 2.74* 2.70*  CALCIUM 9.6 10.3  --   MG 1.2*  --   --    Liver Function Tests: No results for input(s): AST, ALT, ALKPHOS, BILITOT, PROT, ALBUMIN in the last 168 hours. No results for input(s): LIPASE, AMYLASE in the last 168 hours. No results for input(s): AMMONIA in the last 168 hours. CBC:  Recent Labs Lab Oct 02, 2016 2117 10-02-2016 2342 10/02/16 2355  WBC 9.5 8.7  --   NEUTROABS 7.4 5.4  --   HGB 12.2 12.0 13.9  HCT 38.7 39.2 41.0  MCV 92.8 96.6  --   PLT 93* 89*  --     Cardiac Enzymes:  Recent Labs Lab October 02, 2016 2117 02-Oct-2016 2342  TROPONINI 0.14* 0.23*   D-Dimer No results for input(s): DDIMER in the last 72 hours. BNP: Invalid input(s): POCBNP CBG:  Recent Labs Lab 10-02-16 1907  GLUCAP 123*   Anemia work up No results for input(s): VITAMINB12, FOLATE, FERRITIN, TIBC, IRON, RETICCTPCT in the last 72 hours. Urinalysis    Component Value Date/Time   COLORURINE YELLOW 10/02/16 2009   APPEARANCEUR CLEAR 2016-10-02 2009   LABSPEC 1.010 Oct 02, 2016 2009   PHURINE 5.5 10-02-2016 2009   GLUCOSEU NEGATIVE 2016/10/02 2009   HGBUR MODERATE (A) 10/02/2016 2009   HGBUR negative 05/07/2008 1009   BILIRUBINUR NEGATIVE October 02, 2016 2009   KETONESUR NEGATIVE 2016/10/02 2009   PROTEINUR 100 (A) 02-Oct-2016 2009   UROBILINOGEN negative 05/07/2008 1009   NITRITE NEGATIVE Oct 02, 2016 2009   LEUKOCYTESUR NEGATIVE October 02, 2016 2009   Sepsis Labs Invalid input(s): PROCALCITONIN,  WBC,  LACTICIDVEN  On behalf of all of [place] who had the privilege to be involved in the care of this fine lady, our thoughts and prayers are with her family and friends as they mourn her loss.  SIGNED:  Briscoe Deutscher, MD  [facility] 10/01/2016, 2:38 AM Pager   If 7PM-7AM, please contact night-coverage [email or web address] Password [email or web address]

## 2016-10-11 NOTE — Progress Notes (Signed)
The patient unfortunately suffered a PEA arrest shortly after admission, while still in the emergency department. She was intubated and ACLS was directed by the ED physician as detailed in his event note.  The patient has one child, Hortense RamalRoger Kohles, who was reached by phone and came to the hospital to discuss his mother's condition and the goals of care going forward. Mr. Christell ConstantMoore reports that he had discussed the patient's wishes with her in the past and she had indicated to him that she would not want to be kept on life-support. Hortense Ramaloger Arthurs asked that, in line with what he believes to have been his mother's wishes, that we do not escalate care. CODE STATUS was changed to DO NOT RESUSCITATE and the patient's wishes will be respected.

## 2016-10-11 NOTE — ED Notes (Signed)
CRITICAL VALUE ALERT  Critical value received: Troponin 0.23 Date of notification: 10/10/2016 Time of notification: 0035 Critical value read back:Yes.   Nurse who received alert:  GMP MD notified (1st page): Rancour Time of first page: 0035 Responding MD: Rancour Time MD responded: [phone]

## 2016-10-11 NOTE — ED Notes (Signed)
CDS called patient not a suitable candidate

## 2016-10-11 NOTE — ED Notes (Signed)
CRITICAL VALUE ALERT  Critical value received: pH 7.124 Date of notification:  09/10/2016 Time of notification:  0013 Critical value read back:Yes.   Nurse who received alert:  GMP MD notified (1st page): Rancour Time of first page: 0013 Responding MD:  Rancour Time MD responded: [phone]

## 2016-10-11 NOTE — ED Notes (Signed)
After mx discussions with physicians ots family has decided to pull ET tube at this time. Tube pulled at 0105, family and champlain at bedside

## 2016-10-11 NOTE — ED Notes (Signed)
Patient pronounced dead at 0117, Blinda Leatherwoodollina present

## 2016-10-11 NOTE — ED Notes (Signed)
Dr. Manus Gunningancour and Dr. Antionette Charpyd spoke with family who are bedside at this time.

## 2016-10-11 NOTE — ED Notes (Signed)
Dose of Epi 1mg  given at this time per Dr. Christene Slatesancours verbal order

## 2016-10-11 DEATH — deceased

## 2017-01-12 ENCOUNTER — Encounter: Payer: Self-pay | Admitting: Internal Medicine

## 2017-09-15 IMAGING — CR DG CHEST 1V PORT
1 series · 1 of 1 positions shown · non-contrast
Comparison: Chest radiograph 09/17/2016

CLINICAL DATA: Endotracheal tube and nasogastric tube placement

EXAM:
PORTABLE CHEST 1 VIEW

[portable]
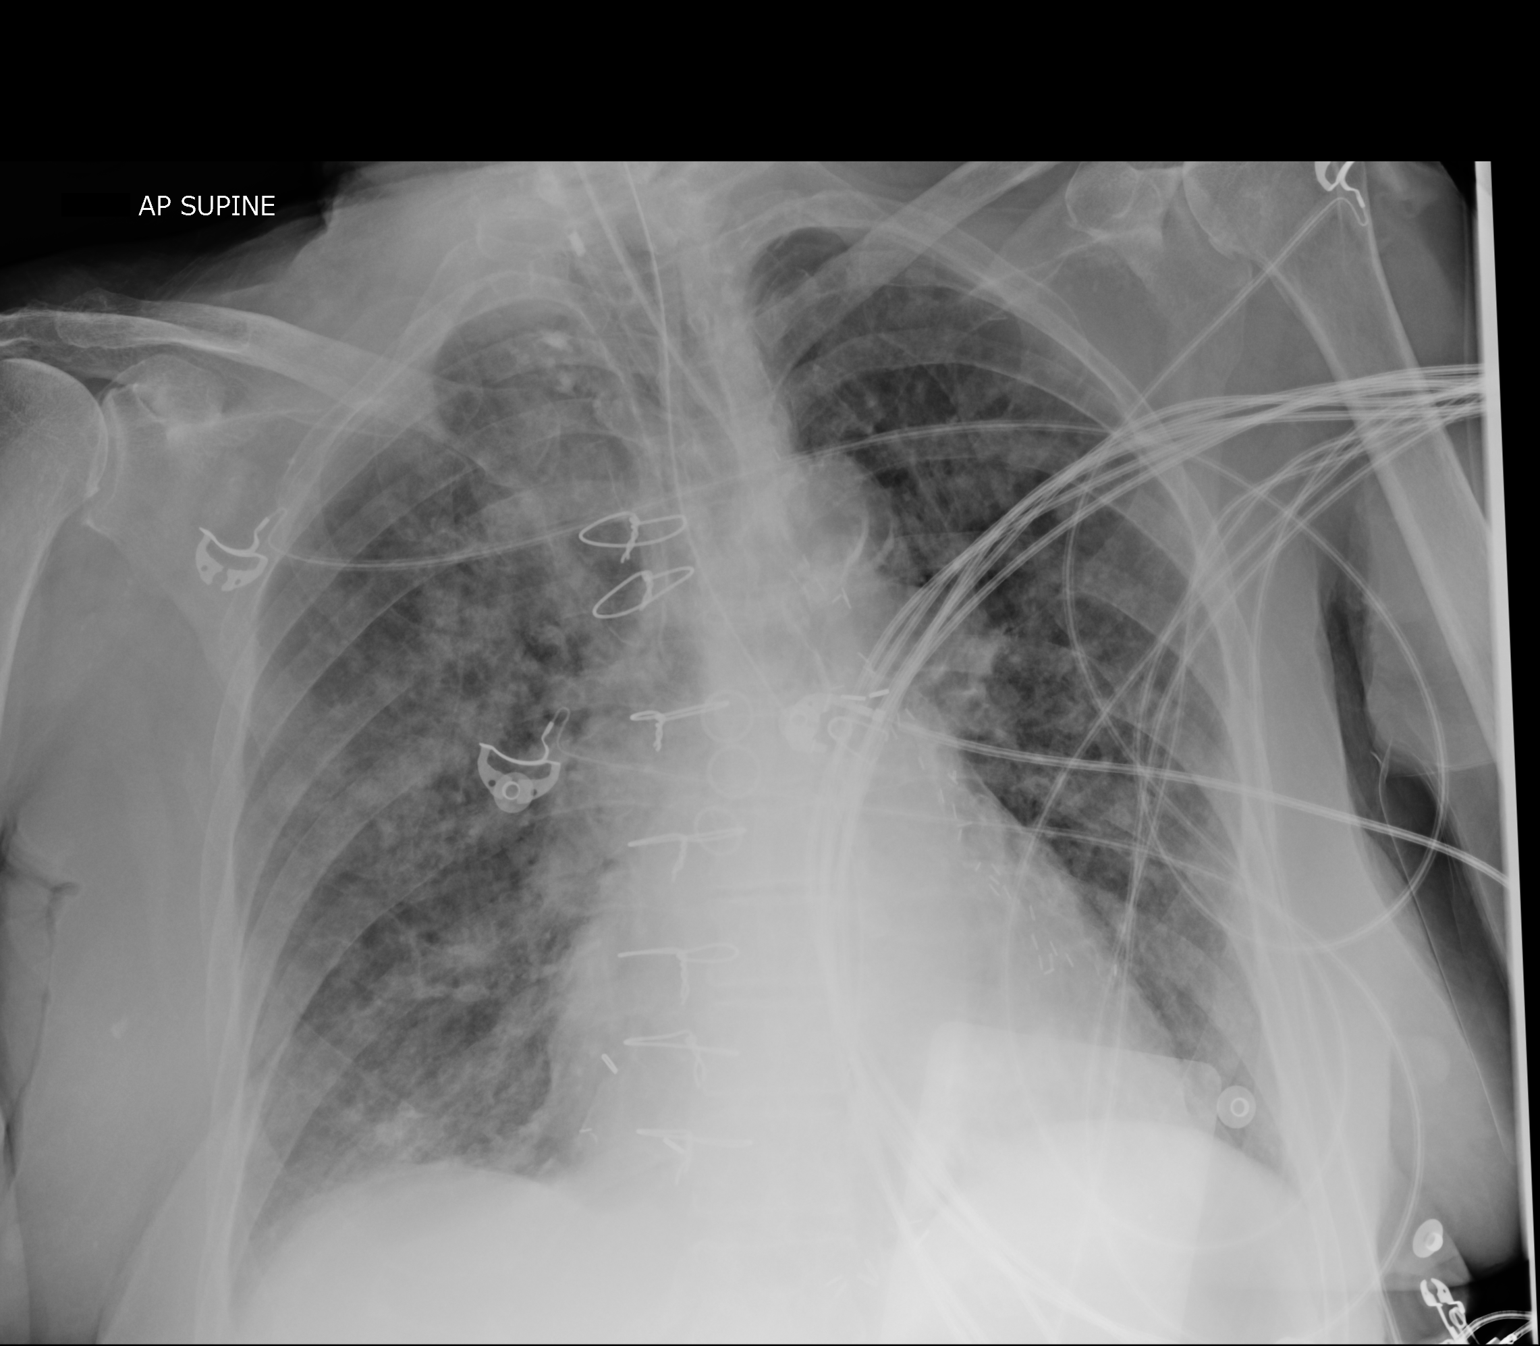

[1 of 1 positions shown; findings below may reference images not displayed]

FINDINGS: Endotracheal tube tip is just below the level of the clavicular
heads. The distal portion of the nasogastric tube this obscured
thigh overlying EKG leads and is not visualized beyond the
diaphragm.

There is cardiomegaly with atherosclerotic calcification of the
thoracic aorta. CABG markers and median sternotomy wires are seen.
There is moderate pulmonary edema, worsened from the prior study.
IMPRESSION: 1. Endotracheal tube tip is below the level of the clavicles.
2. Distal portion of the NG tube is not visualized due to overlying
EKG leads.
3. Cardiomegaly and moderate pulmonary edema, slightly worsened from
the prior study.
4. Aortic atherosclerosis.

## 2017-09-15 IMAGING — CR DG CHEST 1V PORT
1 series · 1 of 1 positions shown · non-contrast
Comparison: 02/03/2009

CLINICAL DATA: Shortness of breath.  Generalized weakness.

EXAM:
PORTABLE CHEST 1 VIEW

[portable]
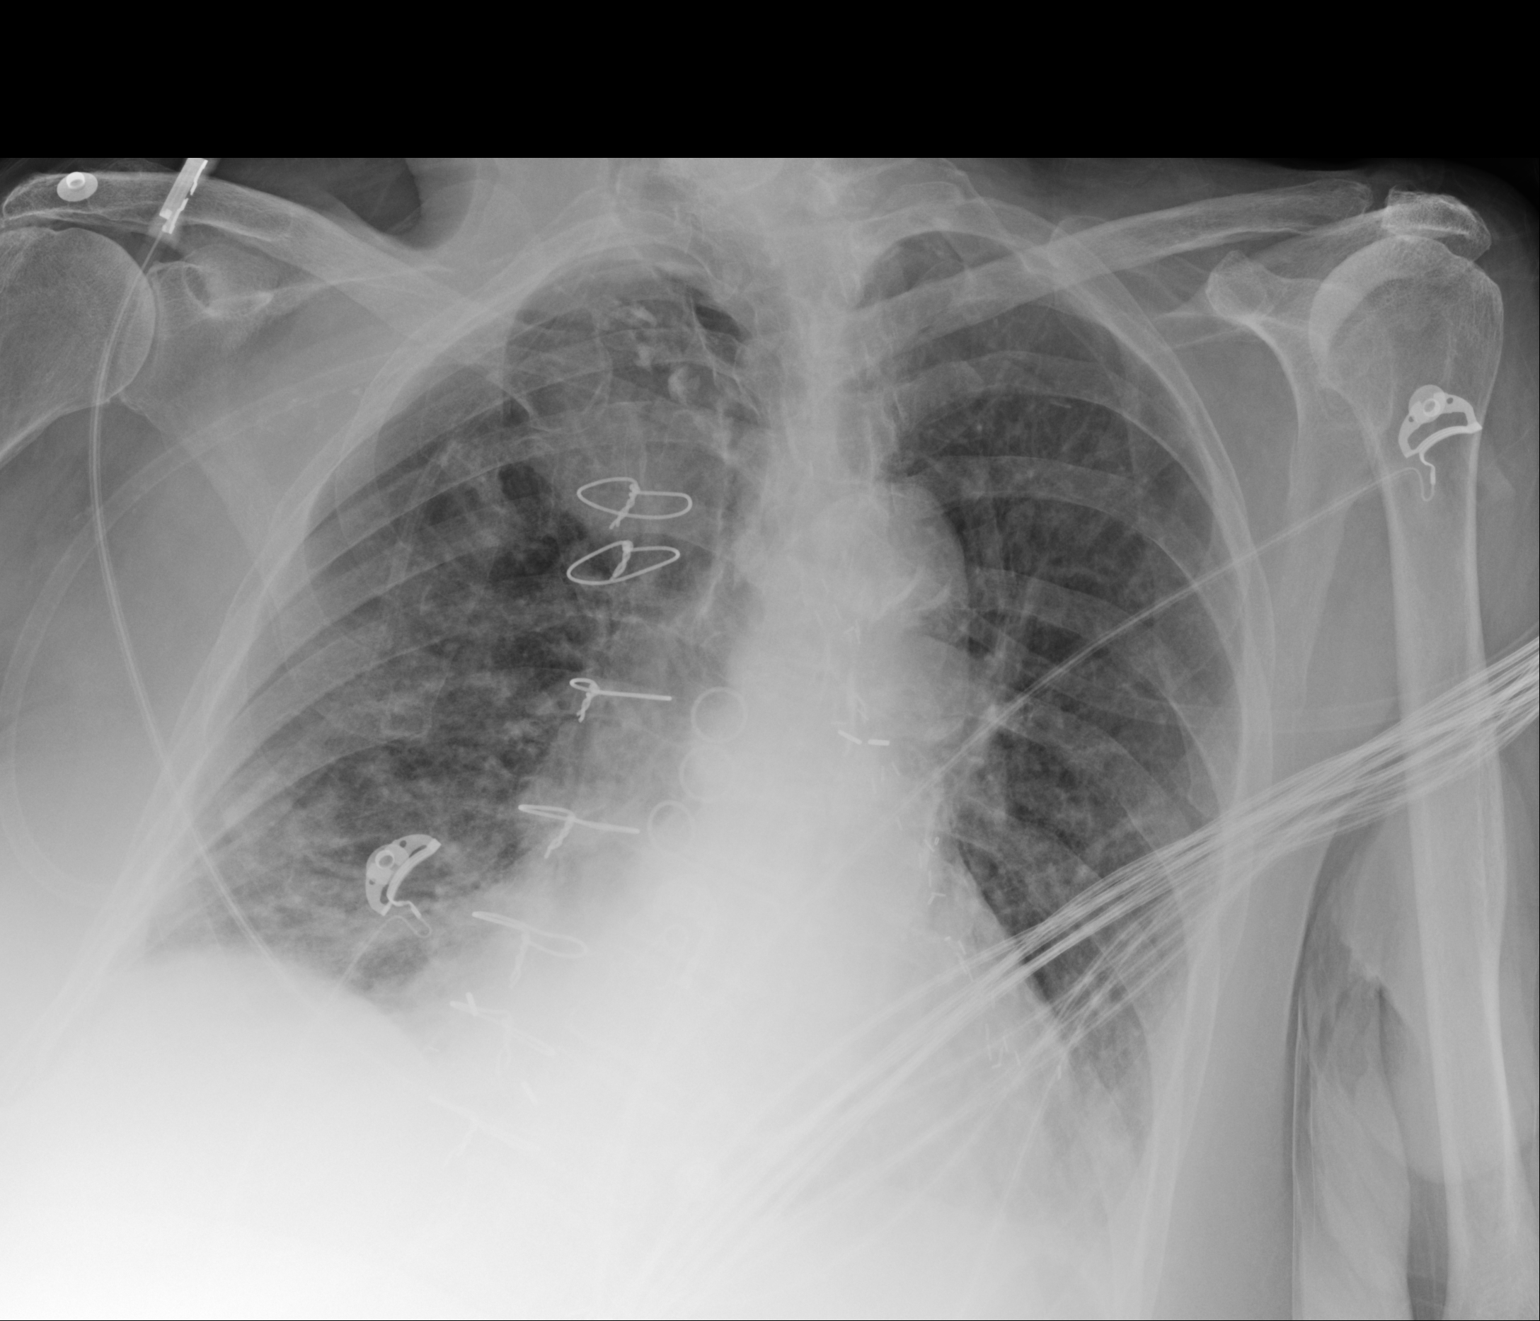

[1 of 1 positions shown; findings below may reference images not displayed]

FINDINGS: Postsurgical changes from CABG is stable. Patient is rotated to the
left.

Cardiomediastinal silhouette is normal for portable technique.
Mediastinal contours appear intact.

There is no evidence of pneumothorax. There is mild interstitial
pulmonary edema. Bilateral small subpulmonic effusions cannot be
excluded. There is a soft tissue thickening in the right
peritracheal stripe with uncertain significance.

Osseous structures are without acute abnormality. Soft tissues are
grossly normal.
IMPRESSION: Mild cardiomegaly.

Interstitial pulmonary edema with possible bilateral small
subpulmonic pleural effusions.

Soft tissue thickening in the right peritracheal stripe with
uncertain significance. Given this is a portable exam, this may
represent summation of shadows. PA and lateral radiograph of the
chest when clinically feasible may be considered to follow-up of
this finding.
